# Patient Record
Sex: Female | Born: 1950 | Race: White | Hispanic: No | Marital: Married | Smoking: Former smoker
Health system: Southern US, Community
[De-identification: ages and names within clinical notes are randomized; demographics above are authoritative.]

## PROBLEM LIST (undated history)

## (undated) DIAGNOSIS — A499 Bacterial infection, unspecified: Secondary | ICD-10-CM

## (undated) DIAGNOSIS — M1990 Unspecified osteoarthritis, unspecified site: Secondary | ICD-10-CM

## (undated) DIAGNOSIS — N898 Other specified noninflammatory disorders of vagina: Secondary | ICD-10-CM

## (undated) DIAGNOSIS — F419 Anxiety disorder, unspecified: Secondary | ICD-10-CM

## (undated) DIAGNOSIS — N3941 Urge incontinence: Secondary | ICD-10-CM

## (undated) DIAGNOSIS — Z8619 Personal history of other infectious and parasitic diseases: Secondary | ICD-10-CM

## (undated) DIAGNOSIS — H3530 Unspecified macular degeneration: Secondary | ICD-10-CM

## (undated) DIAGNOSIS — O00109 Unspecified tubal pregnancy without intrauterine pregnancy: Secondary | ICD-10-CM

## (undated) DIAGNOSIS — K635 Polyp of colon: Secondary | ICD-10-CM

## (undated) DIAGNOSIS — B379 Candidiasis, unspecified: Secondary | ICD-10-CM

## (undated) HISTORY — DX: Bacterial infection, unspecified: A49.9

## (undated) HISTORY — PX: VEIN SURGERY: SHX48

## (undated) HISTORY — DX: Unspecified tubal pregnancy without intrauterine pregnancy: O00.109

## (undated) HISTORY — DX: Polyp of colon: K63.5

## (undated) HISTORY — DX: Unspecified osteoarthritis, unspecified site: M19.90

## (undated) HISTORY — DX: Candidiasis, unspecified: B37.9

## (undated) HISTORY — DX: Urge incontinence: N39.41

## (undated) HISTORY — DX: Personal history of other infectious and parasitic diseases: Z86.19

## (undated) HISTORY — DX: Other specified noninflammatory disorders of vagina: N89.8

## (undated) HISTORY — DX: Anxiety disorder, unspecified: F41.9

## (undated) HISTORY — DX: Unspecified macular degeneration: H35.30

## (undated) HISTORY — PX: TUBAL LIGATION: SHX77

---

## 1974-10-15 HISTORY — PX: CHOLECYSTECTOMY: SHX55

## 1999-09-28 ENCOUNTER — Other Ambulatory Visit: Admission: RE | Admit: 1999-09-28 | Discharge: 1999-09-28 | Payer: Self-pay | Admitting: Obstetrics & Gynecology

## 2001-01-23 ENCOUNTER — Other Ambulatory Visit: Admission: RE | Admit: 2001-01-23 | Discharge: 2001-01-23 | Payer: Self-pay | Admitting: *Deleted

## 2001-02-14 ENCOUNTER — Encounter: Admission: RE | Admit: 2001-02-14 | Discharge: 2001-02-14 | Payer: Self-pay | Admitting: *Deleted

## 2001-02-14 ENCOUNTER — Encounter: Payer: Self-pay | Admitting: *Deleted

## 2003-12-29 ENCOUNTER — Other Ambulatory Visit: Admission: RE | Admit: 2003-12-29 | Discharge: 2003-12-29 | Payer: Self-pay | Admitting: *Deleted

## 2004-02-28 ENCOUNTER — Ambulatory Visit (HOSPITAL_COMMUNITY): Admission: RE | Admit: 2004-02-28 | Discharge: 2004-02-28 | Payer: Self-pay | Admitting: Gastroenterology

## 2004-02-28 ENCOUNTER — Encounter (INDEPENDENT_AMBULATORY_CARE_PROVIDER_SITE_OTHER): Payer: Self-pay | Admitting: *Deleted

## 2005-03-30 ENCOUNTER — Encounter: Admission: RE | Admit: 2005-03-30 | Discharge: 2005-03-30 | Payer: Self-pay | Admitting: *Deleted

## 2005-04-25 ENCOUNTER — Other Ambulatory Visit: Admission: RE | Admit: 2005-04-25 | Discharge: 2005-04-25 | Payer: Self-pay | Admitting: *Deleted

## 2005-11-22 ENCOUNTER — Encounter: Admission: RE | Admit: 2005-11-22 | Discharge: 2006-01-03 | Payer: Self-pay | Admitting: Family Medicine

## 2006-08-05 ENCOUNTER — Encounter: Admission: RE | Admit: 2006-08-05 | Discharge: 2006-08-05 | Payer: Self-pay | Admitting: Obstetrics and Gynecology

## 2007-04-14 ENCOUNTER — Other Ambulatory Visit: Admission: RE | Admit: 2007-04-14 | Discharge: 2007-04-14 | Payer: Self-pay | Admitting: Obstetrics and Gynecology

## 2009-06-16 ENCOUNTER — Encounter: Admission: RE | Admit: 2009-06-16 | Discharge: 2009-06-16 | Payer: Self-pay | Admitting: Obstetrics and Gynecology

## 2009-10-18 ENCOUNTER — Emergency Department (HOSPITAL_COMMUNITY): Admission: EM | Admit: 2009-10-18 | Discharge: 2009-10-18 | Payer: Self-pay | Admitting: Emergency Medicine

## 2011-03-02 NOTE — Op Note (Signed)
NAME:  Judith Gamble, Judith Gamble                    ACCOUNT NO.:  [id number]   MEDICAL RECORD NO.:  [id number]                   PATIENT TYPE:  AMB   LOCATION:  ENDO                                 FACILITY:  [facility]   PHYSICIAN:  Anselmo Rod, M.D.               DATE OF BIRTH:  [birth year]   DATE OF PROCEDURE:  05/24/2004  DATE OF DISCHARGE:  02/28/2004                                 OPERATIVE REPORT   PROCEDURE PERFORMED:  Colonoscopy with snare polypectomy times three.   ENDOSCOPIST:  Charna Elizabeth, M.D.   INSTRUMENT USED:  Olympus video colonoscope.   INDICATIONS FOR PROCEDURE:  The patient is a 60 year old white female  undergoing screening colonoscopy for occasional bright red blood per rectum  and family history of colon cancer in a brother who was diagnosed at 10 and  died at 83.  Rule out colonic polyps, masses, etc.   PREPROCEDURE PREPARATION:  Informed consent was procured from the patient.  The patient was fasted for eight hours prior to the procedure and prepped  with a bottle of magnesium citrate and a gallon of GoLYTELY the night prior  to the procedure.   PREPROCEDURE PHYSICAL:  The patient had stable vital signs.  Neck supple.  Chest clear to auscultation.  S1 and S2 regular.  Abdomen soft with normal  bowel sounds.   DESCRIPTION OF PROCEDURE:  The patient was placed in left lateral decubitus  position and sedated with 100 mg of Demerol and 10 mg of Versed in slow  incremental doses.  Once the patient was adequately sedated and maintained  on low flow oxygen and continuous cardiac monitoring, the Olympus video  colonoscope was advanced from the rectum to the cecum.  A small sessile  polyp was snared from 40 cm.  Two small sessile polyps were snared from the  right colon.  Small internal hemorrhoids were seen on retroflexion. The rest  of the exam was unremarkable.   IMPRESSION:  1. Three polyps snared from the colon, one from 3 cm and two from the right      colon.  2. Small internal hemorrhoids.  3. No evidence of diverticulosis or large masses.   RECOMMENDATIONS:  1. Await pathology results.  2. Repeat colorectal cancer screening depending on pathology results and     patient's symptoms in the future.  3. Avoid all nonsteroidals for the next four weeks.  4. Outpatient followup in the next two weeks for further recommendations.                                               Anselmo Rod, M.D.    JNM/MEDQ  D:  05/24/2004  T:  05/24/2004  Job:  [id number]   cc:   Andres Ege, M.D.  [address]  [place]  [place] [address]  Fax: [phone]

## 2012-01-17 ENCOUNTER — Encounter: Payer: Self-pay | Admitting: Internal Medicine

## 2012-01-17 ENCOUNTER — Ambulatory Visit (INDEPENDENT_AMBULATORY_CARE_PROVIDER_SITE_OTHER): Payer: 59 | Admitting: Internal Medicine

## 2012-01-17 VITALS — BP 118/76 | HR 77 | Temp 97.5°F | Ht 64.0 in | Wt 245.1 lb

## 2012-01-17 DIAGNOSIS — N951 Menopausal and female climacteric states: Secondary | ICD-10-CM

## 2012-01-17 DIAGNOSIS — K635 Polyp of colon: Secondary | ICD-10-CM | POA: Insufficient documentation

## 2012-01-17 DIAGNOSIS — F419 Anxiety disorder, unspecified: Secondary | ICD-10-CM | POA: Insufficient documentation

## 2012-01-17 DIAGNOSIS — M1990 Unspecified osteoarthritis, unspecified site: Secondary | ICD-10-CM

## 2012-01-17 DIAGNOSIS — N3941 Urge incontinence: Secondary | ICD-10-CM | POA: Insufficient documentation

## 2012-01-17 DIAGNOSIS — Z780 Asymptomatic menopausal state: Secondary | ICD-10-CM

## 2012-01-17 DIAGNOSIS — G43909 Migraine, unspecified, not intractable, without status migrainosus: Secondary | ICD-10-CM

## 2012-01-17 DIAGNOSIS — R635 Abnormal weight gain: Secondary | ICD-10-CM

## 2012-01-17 LAB — CBC WITH DIFFERENTIAL/PLATELET
Eosinophils Relative: 1 % (ref 0–5)
HCT: 41.7 % (ref 36.0–46.0)
Hemoglobin: 13.2 g/dL (ref 12.0–15.0)
Lymphocytes Relative: 42 % (ref 12–46)
Lymphs Abs: 2.7 10*3/uL (ref 0.7–4.0)
MCV: 95.9 fL (ref 78.0–100.0)
Monocytes Absolute: 0.4 10*3/uL (ref 0.1–1.0)
Neutro Abs: 3.3 10*3/uL (ref 1.7–7.7)
RBC: 4.35 MIL/uL (ref 3.87–5.11)
WBC: 6.5 10*3/uL (ref 4.0–10.5)

## 2012-01-17 LAB — COMPREHENSIVE METABOLIC PANEL
ALT: 15 U/L (ref 0–35)
Albumin: 4.7 g/dL (ref 3.5–5.2)
BUN: 20 mg/dL (ref 6–23)
CO2: 27 mEq/L (ref 19–32)
Calcium: 9.9 mg/dL (ref 8.4–10.5)
Chloride: 103 mEq/L (ref 96–112)
Creat: 0.77 mg/dL (ref 0.50–1.10)
Potassium: 4.5 mEq/L (ref 3.5–5.3)

## 2012-01-17 LAB — LIPID PANEL
Cholesterol: 223 mg/dL — ABNORMAL HIGH (ref 0–200)
HDL: 56 mg/dL (ref >39)
Total CHOL/HDL Ratio: 4 Ratio

## 2012-01-17 LAB — TSH: TSH: 1.289 u[IU]/mL (ref 0.350–4.500)

## 2012-01-17 NOTE — Progress Notes (Signed)
  Subjective:    Patient ID: Judith Gamble, female    DOB: 1950/12/15, 61 y.o.   MRN: [id number]  HPI  New pt here for first visit.  PMH of OA, anxiety, migraine headache, urge incontinence and colon polyps.  She also describes a foot fracture and tendon injury from a prior fall.    Her GYN MD Dr. Pennie Rushing placed her on HT for symptomatic menopause and she started with vaginal spotting.  She has a follow up appt with Dr. Pennie Rushing later this month.  LMP 08/2010.    She is concerned about her increased weight and she has problems with short term memory.  Not sure if she is vitamin deficient.   She admits she eats late at night and sits all day at work and sits and watches TV with her husband.  Very little mobility  No Known Allergies Past Medical History  Diagnosis Date  . Migraine   . Urge incontinence   . Anxiety   . Arthritis   . Colon polyps    Past Surgical History  Procedure Date  . Cholecystectomy 1976   History   Social History  . Marital Status: Married    Spouse Name: N/A    Number of Children: N/A  . Years of Education: N/A   Occupational History  . Not on file.   Social History Main Topics  . Smoking status: Former Smoker    Quit date: 10/16/1975  . Smokeless tobacco: Never Used  . Alcohol Use: Yes     socially  . Drug Use: No  . Sexually Active: Yes    Birth Control/ Protection: Post-menopausal   Other Topics Concern  . Not on file   Social History Narrative  . No narrative on file   Family History  Problem Relation Age of Onset  . Arthritis Mother   . Diabetes Mother   . Heart disease Brother   . Kidney disease Brother   . Colon cancer Brother 48  . Drug abuse Brother    Patient Active Problem List  Diagnoses  . Anxiety  . Arthritis  . Migraine  . Urge incontinence  . Colon polyps   No current outpatient prescriptions on file prior to visit.      Review of Systems    see HPI Objective:   Physical Exam Physical Exam    Nursing note and vitals reviewed.  Constitutional: She is oriented to person, place, and time. She appears well-developed and well-nourished.  HENT:  Head: Normocephalic and atraumatic.  Cardiovascular: Normal rate and regular rhythm. Exam reveals no gallop and no friction rub.  No murmur heard.  Pulmonary/Chest: Breath sounds normal. She has no wheezes. She has no rales.  Neurological: She is alert and oriented to person, place, and time.  Skin: Skin is warm and dry.  Psychiatric: She has a normal mood and affect. Her behavior is normal.        Assessment & Plan:  1)  Weight gain:  Will check thyroid and refer to Dr. Kinnie Scales 2)  Vaginal spotting  I counseled pt to keep appt with Dr. Pennie Rushing  Pap 12/2011 Dr. Pennie Rushing  3)  OA 4)  Anxiety 5)   Migraine headaches  Pt reports less frequent now.   6)  Colon polyps  Last colonoscopy 2008  . Pt is aware she need repeat colonoscopy this year 7)  Memory most likely short term memory loss secondary to aging  Last tetanus 2011

## 2012-01-17 NOTE — Patient Instructions (Signed)
Labs will be mailed to you  Will refer to Dr. Ritta Slot  Schedule  Physical with me

## 2012-01-18 LAB — VITAMIN D 25 HYDROXY (VIT D DEFICIENCY, FRACTURES): Vit D, 25-Hydroxy: 31 ng/mL (ref 30–89)

## 2012-01-22 ENCOUNTER — Encounter: Payer: Self-pay | Admitting: Emergency Medicine

## 2012-01-28 DIAGNOSIS — N762 Acute vulvitis: Secondary | ICD-10-CM | POA: Insufficient documentation

## 2012-01-28 DIAGNOSIS — R5383 Other fatigue: Secondary | ICD-10-CM

## 2012-01-28 DIAGNOSIS — M1990 Unspecified osteoarthritis, unspecified site: Secondary | ICD-10-CM

## 2012-02-04 ENCOUNTER — Encounter: Payer: Self-pay | Admitting: Obstetrics and Gynecology

## 2012-02-19 ENCOUNTER — Encounter: Payer: Self-pay | Admitting: Obstetrics and Gynecology

## 2012-04-02 ENCOUNTER — Other Ambulatory Visit: Payer: Self-pay | Admitting: Internal Medicine

## 2012-04-02 ENCOUNTER — Ambulatory Visit (HOSPITAL_BASED_OUTPATIENT_CLINIC_OR_DEPARTMENT_OTHER)
Admission: RE | Admit: 2012-04-02 | Discharge: 2012-04-02 | Disposition: A | Discharge status: Home / Self Care | Payer: 59 | Source: Ambulatory Visit | Attending: Internal Medicine | Admitting: Internal Medicine

## 2012-04-02 ENCOUNTER — Ambulatory Visit (INDEPENDENT_AMBULATORY_CARE_PROVIDER_SITE_OTHER): Payer: 59 | Admitting: Internal Medicine

## 2012-04-02 ENCOUNTER — Encounter: Payer: Self-pay | Admitting: Internal Medicine

## 2012-04-02 VITALS — BP 118/73 | HR 72 | Temp 97.7°F | Resp 16 | Ht 64.0 in | Wt 247.0 lb

## 2012-04-02 DIAGNOSIS — Z0000 Encounter for general adult medical examination without abnormal findings: Secondary | ICD-10-CM

## 2012-04-02 DIAGNOSIS — K635 Polyp of colon: Secondary | ICD-10-CM

## 2012-04-02 DIAGNOSIS — D126 Benign neoplasm of colon, unspecified: Secondary | ICD-10-CM

## 2012-04-02 DIAGNOSIS — Z1231 Encounter for screening mammogram for malignant neoplasm of breast: Secondary | ICD-10-CM | POA: Insufficient documentation

## 2012-04-02 DIAGNOSIS — E785 Hyperlipidemia, unspecified: Secondary | ICD-10-CM

## 2012-04-02 DIAGNOSIS — M129 Arthropathy, unspecified: Secondary | ICD-10-CM

## 2012-04-02 DIAGNOSIS — N898 Other specified noninflammatory disorders of vagina: Secondary | ICD-10-CM

## 2012-04-02 DIAGNOSIS — N939 Abnormal uterine and vaginal bleeding, unspecified: Secondary | ICD-10-CM | POA: Insufficient documentation

## 2012-04-02 DIAGNOSIS — M1990 Unspecified osteoarthritis, unspecified site: Secondary | ICD-10-CM

## 2012-04-02 DIAGNOSIS — E669 Obesity, unspecified: Secondary | ICD-10-CM

## 2012-04-02 LAB — POCT URINALYSIS DIPSTICK
Bilirubin, UA: NEGATIVE
Blood, UA: NEGATIVE
Glucose, UA: NEGATIVE
Spec Grav, UA: 1.01

## 2012-04-02 NOTE — Progress Notes (Signed)
Subjective:    Patient ID: Judith Gamble, female    DOB: 04/09/1951, 61 y.o.   MRN: [id number]  HPI Judith Gamble is here for comprehensive eval.   She reports she has not seen Dr. Pennie Rushing yet for her vaginal spotting but states she would like to go back to Dr. Particia Jasper office .  She has stopped taking the Cl[place] and has not had any spotting since her last visit.  She has occasional night sweats now.  She did see Dr. Kinnie Scales for weight loss consult .  She is on a 1200 calorie diet.  See lipids.  She does not wish to take statins for this since she is changing her diet.    She did receive notification from Dr. Kenna Gilbert office that she is due for a colonoscopy.  She states she will schedule this.  Allergies  Allergen Reactions  . Mold Extract (Trichophyton Mentagrophyte)    Past Medical History  Diagnosis Date  . Migraine   . Urge incontinence   . Anxiety   . Arthritis   . Colon polyps   . Vaginal dryness   . Bacterial infection   . Yeast infection   . Pregnancy, ectopic, tubal   . Hx of varicella   . Hx of measles    Past Surgical History  Procedure Date  . Cholecystectomy 1976  . Tubal ligation    History   Social History  . Marital Status: Married    Spouse Name: N/A    Number of Children: N/A  . Years of Education: N/A   Occupational History  . Not on file.   Social History Main Topics  . Smoking status: Former Smoker    Quit date: 10/16/1975  . Smokeless tobacco: Never Used  . Alcohol Use: Yes     socially  . Drug Use: No  . Sexually Active: Yes    Birth Control/ Protection: Post-menopausal   Other Topics Concern  . Not on file   Social History Narrative  . No narrative on file   Family History  Problem Relation Age of Onset  . Arthritis Mother   . Diabetes Mother   . Heart disease Brother   . Kidney disease Brother   . Colon cancer Brother 48  . Drug abuse Brother    Patient Active Problem List  Diagnosis  . Anxiety  . Arthritis  .  Migraine  . Urge incontinence  . Colon polyps  . Lethargy  . Vulvitis   Current Outpatient Prescriptions on File Prior to Visit  Medication Sig Dispense Refill  . b complex vitamins tablet Take 1 tablet by mouth daily.      . cholecalciferol (VITAMIN D) 1000 UNITS tablet Take 1,000 Units by mouth daily.      . Melatonin 1 MG TABS Take 1 tablet by mouth at bedtime as needed.      Marland Kitchen estradiol-norethindrone (COMBIPATCH) 0.05-0.25 MG/DAY Place 1 patch onto the skin 2 (two) times a week.          Review of Systems  Constitutional: Negative.   HENT: Negative.   All other systems reviewed and are negative.      Objective:   Physical Exam Physical Exam  Nursing note and vitals reviewed.  Constitutional: She is oriented to person, place, and time. She appears well-developed and well-nourished.  HENT:  Head: Normocephalic and atraumatic.  Right Ear: Tympanic membrane and ear canal normal. No drainage. Tympanic membrane is not injected and not erythematous.  Left Ear: Tympanic  membrane and ear canal normal. No drainage. Tympanic membrane is not injected and not erythematous.  Nose: Nose normal. Right sinus exhibits no maxillary sinus tenderness and no frontal sinus tenderness. Left sinus exhibits no maxillary sinus tenderness and no frontal sinus tenderness.  Mouth/Throat: Oropharynx is clear and moist. No oral lesions. No oropharyngeal exudate.  Eyes: Conjunctivae and EOM are normal. Pupils are equal, round, and reactive to light.  Neck: Normal range of motion. Neck supple. No JVD present. Carotid bruit is not present. No mass and no thyromegaly present.  Cardiovascular: Normal rate, regular rhythm, S1 normal, S2 normal and intact distal pulses. Exam reveals no gallop and no friction rub.  No murmur heard.  Pulses:  Carotid pulses are 2+ on the right side, and 2+ on the left side.  Dorsalis pedis pulses are 2+ on the right side, and 2+ on the left side.  No carotid bruit. No LE edema    Pulmonary/Chest: Breath sounds normal. She has no wheezes. She has no rales. She exhibits no tenderness. Breasts:  No discrete masses no nipple discharge no axillary adenopathy bilaterally Abdominal: Soft. Bowel sounds are normal. She exhibits no distension and no mass. There is no hepatosplenomegaly. There is no tenderness. There is no CVA tenderness.  Musculoskeletal: Normal range of motion.  No active synovitis to joints.  Lymphadenopathy:  She has no cervical adenopathy.  She has no axillary adenopathy.  Right: No inguinal and no supraclavicular adenopathy present.  Left: No inguinal and no supraclavicular adenopathy present.  Neurological: She is alert and oriented to person, place, and time. She has normal strength and normal reflexes. She displays no tremor. No cranial nerve deficit or sensory deficit. Coordination and gait normal.  Skin: Skin is warm and dry. No rash noted. No cyanosis. Nails show no clubbing.  Psychiatric: She has a normal mood and affect. Her speech is normal and behavior is normal. Cognition and memory are normal.           Assessment & Plan:  Health Maintenance:  Pt advised to schedule colonoscopy with Dr. Loreta Ave,  Mammogram today.  Will give RX for Zostavax vaccine.  Tdap done 2011.  Pap to be done by her GYN  Vaginal spotting  I counseled pt that she may need endometrial biopsy and it is very important to make appt with GYN.  She wishes to go back to Dr. Buddy Duty' office and states she will make appt with Dr. Algie Coffer  Hyperlipidemia  Fish oil  , Dash diet recheck 6 months  Obesity  Dr. Kinnie Scales managing  DJD  Colon polyps  See above    History of migraine headache

## 2012-04-02 NOTE — Patient Instructions (Addendum)
Call for appt with your GI doctor  Dr. Loreta Ave and your GYN at Dr. Particia Jasper office

## 2012-07-23 ENCOUNTER — Ambulatory Visit: Payer: 59 | Admitting: Internal Medicine

## 2012-07-23 ENCOUNTER — Ambulatory Visit (INDEPENDENT_AMBULATORY_CARE_PROVIDER_SITE_OTHER): Payer: 59 | Admitting: Internal Medicine

## 2012-07-23 ENCOUNTER — Encounter: Payer: Self-pay | Admitting: Internal Medicine

## 2012-07-23 ENCOUNTER — Ambulatory Visit (HOSPITAL_BASED_OUTPATIENT_CLINIC_OR_DEPARTMENT_OTHER)
Admission: RE | Admit: 2012-07-23 | Discharge: 2012-07-23 | Disposition: A | Discharge status: Home / Self Care | Payer: 59 | Source: Ambulatory Visit | Attending: Internal Medicine | Admitting: Internal Medicine

## 2012-07-23 VITALS — BP 110/76 | HR 86 | Temp 97.1°F | Resp 20 | Wt 224.4 lb

## 2012-07-23 DIAGNOSIS — H53489 Generalized contraction of visual field, unspecified eye: Secondary | ICD-10-CM | POA: Insufficient documentation

## 2012-07-23 DIAGNOSIS — R42 Dizziness and giddiness: Secondary | ICD-10-CM | POA: Insufficient documentation

## 2012-07-23 DIAGNOSIS — R519 Headache, unspecified: Secondary | ICD-10-CM | POA: Insufficient documentation

## 2012-07-23 DIAGNOSIS — R51 Headache: Secondary | ICD-10-CM

## 2012-07-23 MED ORDER — SUMATRIPTAN SUCCINATE 50 MG PO TABS: ORAL_TABLET | ORAL | Status: DC

## 2012-07-23 NOTE — Progress Notes (Signed)
Subjective:    Patient ID: Judith Gamble, female    DOB: [birth year], 61 y.o.   MRN: [id number]  HPI Lindzey is here for acute visit.  She has been having daily headache for the past 6-8 weeks associated with tunnel vision, some nausea, no vomiting.  Headache present in the morning.  She was told by someone in an opthalmologist office that she may have migraines. Only taken OTC meds  Headache located bilateral  Parietal area, associated with .  No FH of migraine to her knowledge.  Has photophobia no phonophobia.  Also associated with "room spinning"  She has been seeing chiropracter for this thinking it was a problem with her neck stiffness  Allergies  Allergen Reactions  . Mold Extract (Trichophyton Mentagrophyte)    Past Medical History  Diagnosis Date  . Migraine   . Urge incontinence   . Anxiety   . Arthritis   . Colon polyps   . Vaginal dryness   . Bacterial infection   . Yeast infection   . Pregnancy, ectopic, tubal   . Hx of varicella   . Hx of measles    Past Surgical History  Procedure Date  . Cholecystectomy 1976  . Tubal ligation    History   Social History  . Marital Status: Married    Spouse Name: N/A    Number of Children: N/A  . Years of Education: N/A   Occupational History  . Not on file.   Social History Main Topics  . Smoking status: Former Smoker    Quit date: 10/16/1975  . Smokeless tobacco: Never Used  . Alcohol Use: Yes     socially  . Drug Use: No  . Sexually Active: Yes    Birth Control/ Protection: Post-menopausal   Other Topics Concern  . Not on file   Social History Narrative  . No narrative on file   Family History  Problem Relation Age of Onset  . Arthritis Mother   . Diabetes Mother   . Heart disease Brother   . Kidney disease Brother   . Colon cancer Brother 48  . Drug abuse Brother    Patient Active Problem List  Diagnosis  . Anxiety  . Arthritis  . Urge incontinence  . Colon polyps  . Lethargy  .  Vulvitis  . Hyperlipidemia  . Vaginal spotting  . Obesity  . Vertigo  . Headache   Current Outpatient Prescriptions on File Prior to Visit  Medication Sig Dispense Refill  . b complex vitamins tablet Take 1 tablet by mouth daily.      . cholecalciferol (VITAMIN D) 1000 UNITS tablet Take 1,000 Units by mouth daily.      . phentermine 37.5 MG capsule Take 37.5 mg by mouth every morning.      . Melatonin 1 MG TABS Take 1 tablet by mouth at bedtime as needed.      . SUMAtriptan (IMITREX) 50 MG tablet Take one tablet at onset of migraine.  May repeat one time only in 2 hours  10 tablet  0       Review of Systems See HPI    Objective:   Physical Exam Physical Exam  Nursing note and vitals reviewed.  Constitutional: She is oriented to person, place, and time. She appears well-developed and well-nourished.  HENT:  Head: Normocephalic and atraumatic.  Cardiovascular: Normal rate and regular rhythm. Exam reveals no gallop and no friction rub.  No murmur heard.  Pulmonary/Chest: Breath sounds normal. She  has no wheezes. She has no rales.  Neurological: She is alert and oriented to person, place, and time.  CNII-XII intact Reflexes 2+ symmetric Motor 5/5 uE and LE Romberg  Neg Sensory intact to pp no asymmetry Cerebellar intact FTN Skin: Skin is warm and dry.  Psychiatric: She has a normal mood and affect. Her behavior is normal.              Assessment & Plan:  Headache:  Given constellation of symptoms with visual changes and vertigo will get Head CT.  Imitrex 50 mg given in office without side effects.  Ok to take 100 mg in am if not better.  Pt counseled not to take more that 2 Imitrex in 24 hours  Vertigo  Antivert 25 mg q8h prm  Rx e-scribed  Further management based on results.  She is to see me in office next week

## 2012-07-24 ENCOUNTER — Telehealth: Payer: Self-pay | Admitting: Internal Medicine

## 2012-07-24 MED ORDER — MECLIZINE HCL 25 MG PO TABS: 25.0000 mg | ORAL_TABLET | Freq: Three times a day (TID) | ORAL | Status: DC | PRN

## 2012-07-24 NOTE — Telephone Encounter (Signed)
Judith Gamble  Call pt and let her know that her Head CT is negative .  I do not see a follow up appt with me next week.  Give her an appt for later in the week and let her know I want to see how her headaches are doing and we can go over CT in detail  Message back with appt date    thanks

## 2012-07-24 NOTE — Telephone Encounter (Signed)
Called and left message regarding appt date next week

## 2012-07-29 ENCOUNTER — Telehealth: Payer: Self-pay | Admitting: *Deleted

## 2012-07-29 NOTE — Telephone Encounter (Signed)
Judith Gamble   Call pt and give her a follow up appt next week

## 2012-07-29 NOTE — Telephone Encounter (Signed)
appt wed 23 at 430

## 2012-07-29 NOTE — Telephone Encounter (Signed)
Call pt and give her an appt next week

## 2012-07-29 NOTE — Telephone Encounter (Signed)
Awaiting return call for follow nup appt next weekl

## 2012-08-06 ENCOUNTER — Ambulatory Visit (INDEPENDENT_AMBULATORY_CARE_PROVIDER_SITE_OTHER): Payer: 59 | Admitting: Internal Medicine

## 2012-08-06 ENCOUNTER — Encounter: Payer: Self-pay | Admitting: Internal Medicine

## 2012-08-06 VITALS — BP 119/80 | HR 86 | Temp 97.6°F | Resp 20 | Wt 223.0 lb

## 2012-08-06 DIAGNOSIS — R51 Headache: Secondary | ICD-10-CM

## 2012-08-06 DIAGNOSIS — Z23 Encounter for immunization: Secondary | ICD-10-CM

## 2012-08-06 DIAGNOSIS — R42 Dizziness and giddiness: Secondary | ICD-10-CM

## 2012-08-06 MED ORDER — TRAMADOL-ACETAMINOPHEN 37.5-325 MG PO TABS: 1.0000 | ORAL_TABLET | Freq: Four times a day (QID) | ORAL | Status: DC | PRN

## 2012-08-06 NOTE — Progress Notes (Signed)
Subjective:    Patient ID: Judith Gamble, female    DOB: 04/27/1951, 61 y.o.   MRN: [id number]  HPI Judith Gamble is here for follow up.  She states Imitrex in minimally helpful and she was only allowed 4 tablets with her insurance.  Vertigo is improving and comes rarely now  She does have some neck stiffness but this has been longstanding over years.  Denies injury, she has been exercising more.  She has seen a chirpopracter in the past  She has seen Dr. Randon Goldsmith the opthalmologist who says she has a vitreous tear in L eye  Ct of brain is negative  Allergies  Allergen Reactions  . Mold Extract (Trichophyton Mentagrophyte)    Past Medical History  Diagnosis Date  . Migraine   . Urge incontinence   . Anxiety   . Arthritis   . Colon polyps   . Vaginal dryness   . Bacterial infection   . Yeast infection   . Pregnancy, ectopic, tubal   . Hx of varicella   . Hx of measles    Past Surgical History  Procedure Date  . Cholecystectomy 1976  . Tubal ligation    History   Social History  . Marital Status: Married    Spouse Name: N/A    Number of Children: N/A  . Years of Education: N/A   Occupational History  . Not on file.   Social History Main Topics  . Smoking status: Former Smoker    Quit date: 10/16/1975  . Smokeless tobacco: Never Used  . Alcohol Use: Yes     socially  . Drug Use: No  . Sexually Active: Yes    Birth Control/ Protection: Post-menopausal   Other Topics Concern  . Not on file   Social History Narrative  . No narrative on file   Family History  Problem Relation Age of Onset  . Arthritis Mother   . Diabetes Mother   . Heart disease Brother   . Kidney disease Brother   . Colon cancer Brother 48  . Drug abuse Brother    Patient Active Problem List  Diagnosis  . Anxiety  . Arthritis  . Urge incontinence  . Colon polyps  . Lethargy  . Vulvitis  . Hyperlipidemia  . Vaginal spotting  . Obesity  . Vertigo  . Headache   Current  Outpatient Prescriptions on File Prior to Visit  Medication Sig Dispense Refill  . b complex vitamins tablet Take 1 tablet by mouth daily.      . cholecalciferol (VITAMIN D) 1000 UNITS tablet Take 1,000 Units by mouth daily.      . SUMAtriptan (IMITREX) 50 MG tablet Take one tablet at onset of migraine.  May repeat one time only in 2 hours  10 tablet  0  . meclizine (ANTIVERT) 25 MG tablet Take 1 tablet (25 mg total) by mouth 3 (three) times daily as needed.  30 tablet  0  . Melatonin 1 MG TABS Take 1 tablet by mouth at bedtime as needed.      . phentermine 37.5 MG capsule Take 37.5 mg by mouth every morning.           Review of Systems    see HPI Objective:   Physical Exam Physical Exam  Nursing note and vitals reviewed.  Constitutional: She is oriented to person, place, and time. She appears well-developed and well-nourished.  HENT:  Head: Normocephalic and atraumatic.  Cardiovascular: Normal rate and regular rhythm. Exam reveals no  gallop and no friction rub.  No murmur heard.  Pulmonary/Chest: Breath sounds normal. She has no wheezes. She has no rales.  Neurological: She is alert and oriented to person, place, and time.  Exam unchanged from last viist Skin: Skin is warm and dry.  Psychiatric: She has a normal mood and affect. Her behavior is normal.             Assessment & Plan:  Headache:  Ok to give ultracet on a short term basis and will refer to [facility].  Rx given  Will give influenza vaccine

## 2012-08-21 ENCOUNTER — Encounter: Payer: Self-pay | Admitting: Internal Medicine

## 2012-08-21 ENCOUNTER — Ambulatory Visit (INDEPENDENT_AMBULATORY_CARE_PROVIDER_SITE_OTHER): Payer: 59 | Admitting: Internal Medicine

## 2012-08-21 VITALS — BP 124/73 | HR 66 | Temp 97.7°F | Resp 20 | Wt 221.0 lb

## 2012-08-21 DIAGNOSIS — R3915 Urgency of urination: Secondary | ICD-10-CM

## 2012-08-21 DIAGNOSIS — N390 Urinary tract infection, site not specified: Secondary | ICD-10-CM

## 2012-08-21 LAB — POCT URINALYSIS DIPSTICK
Blood, UA: NEGATIVE
Nitrite, UA: NEGATIVE
Spec Grav, UA: 1.02
Urobilinogen, UA: NEGATIVE
pH, UA: 6.5

## 2012-08-21 MED ORDER — CIPROFLOXACIN HCL 500 MG PO TABS: 500.0000 mg | ORAL_TABLET | Freq: Two times a day (BID) | ORAL | Status: DC

## 2012-08-21 NOTE — Patient Instructions (Addendum)
See me as needed 

## 2012-08-21 NOTE — Progress Notes (Signed)
Subjective:    Patient ID: Judith Gamble, female    DOB: 16-Jan-1951, 61 y.o.   MRN: [id number]  HPI Kenlei is here with acute visit.  She has had dysuria and frequency last several days.  Felt like fever but not documented.  Some nausea but no vomiting.  She did start topamax yesterday    No CVA pain  Allergies  Allergen Reactions  . Mold Extract (Trichophyton Mentagrophyte)    Past Medical History  Diagnosis Date  . Migraine   . Urge incontinence   . Anxiety   . Arthritis   . Colon polyps   . Vaginal dryness   . Bacterial infection   . Yeast infection   . Pregnancy, ectopic, tubal   . Hx of varicella   . Hx of measles    Past Surgical History  Procedure Date  . Cholecystectomy 1976  . Tubal ligation    History   Social History  . Marital Status: Married    Spouse Name: N/A    Number of Children: N/A  . Years of Education: N/A   Occupational History  . Not on file.   Social History Main Topics  . Smoking status: Former Smoker    Quit date: 10/16/1975  . Smokeless tobacco: Never Used  . Alcohol Use: Yes     Comment: socially  . Drug Use: No  . Sexually Active: Yes    Birth Control/ Protection: Post-menopausal   Other Topics Concern  . Not on file   Social History Narrative  . No narrative on file   Family History  Problem Relation Age of Onset  . Arthritis Mother   . Diabetes Mother   . Heart disease Brother   . Kidney disease Brother   . Colon cancer Brother 48  . Drug abuse Brother    Patient Active Problem List  Diagnosis  . Anxiety  . Arthritis  . Urge incontinence  . Colon polyps  . Lethargy  . Vulvitis  . Hyperlipidemia  . Vaginal spotting  . Obesity  . Vertigo  . Headache  . UTI (lower urinary tract infection)   Current Outpatient Prescriptions on File Prior to Visit  Medication Sig Dispense Refill  . topiramate (TOPAMAX) 15 MG capsule Take 15 mg by mouth 2 (two) times daily.      Marland Kitchen b complex vitamins tablet Take 1  tablet by mouth daily.      . cholecalciferol (VITAMIN D) 1000 UNITS tablet Take 1,000 Units by mouth daily.      . meclizine (ANTIVERT) 25 MG tablet Take 1 tablet (25 mg total) by mouth 3 (three) times daily as needed.  30 tablet  0  . Melatonin 1 MG TABS Take 1 tablet by mouth at bedtime as needed.      . phentermine 37.5 MG capsule Take 37.5 mg by mouth every morning.      . SUMAtriptan (IMITREX) 50 MG tablet Take one tablet at onset of migraine.  May repeat one time only in 2 hours  10 tablet  0  . traMADol-acetaminophen (ULTRACET) 37.5-325 MG per tablet Take 1 tablet by mouth every 6 (six) hours as needed for pain.  30 tablet  1         Review of Systems See HPI    Objective:   Physical Exam Physical Exam  Nursing note and vitals reviewed.  Constitutional: She is oriented to person, place, and time. She appears well-developed and well-nourished.  HENT:  Head: Normocephalic and  atraumatic.  Cardiovascular: Normal rate and regular rhythm. Exam reveals no gallop and no friction rub.  No murmur heard.  Pulmonary/Chest: Breath sounds normal. She has no wheezes. She has no rales. Abd:  Soft /ND  No CVA tenderness BS +  Tender in suprapubic area Neurological: She is alert and oriented to person, place, and time.  Skin: Skin is warm and dry.  Psychiatric: She has a normal mood and affect. Her behavior is normal.              Assessment & Plan:  UTI  LE on U/A  Will give cipro

## 2012-08-24 LAB — URINE CULTURE

## 2012-11-10 ENCOUNTER — Telehealth: Payer: Self-pay | Admitting: Internal Medicine

## 2012-11-10 MED ORDER — NITROFURANTOIN MACROCRYSTAL 100 MG PO CAPS: ORAL_CAPSULE | ORAL | Status: DC

## 2012-11-10 NOTE — Telephone Encounter (Signed)
Spoke with pt .  Still having urinary symptoms  Will give 2 week course of macrodantin and instructed pt to see me in 3 weeks for repeat U/A to test for cure.  She voices understanding

## 2012-11-10 NOTE — Telephone Encounter (Signed)
Pt states she is suffering with a urinary tract infection... She would like to know if something can be prescribe to her... She states the last meds did not work to well.... She would like a phone call back [phone].Marland KitchenMarland Kitchen

## 2012-11-13 ENCOUNTER — Encounter: Payer: Self-pay | Admitting: Internal Medicine

## 2012-11-13 ENCOUNTER — Ambulatory Visit (INDEPENDENT_AMBULATORY_CARE_PROVIDER_SITE_OTHER): Payer: Managed Care, Other (non HMO) | Admitting: Internal Medicine

## 2012-11-13 VITALS — BP 120/72 | HR 68 | Temp 97.4°F | Resp 18 | Wt 226.0 lb

## 2012-11-13 DIAGNOSIS — B359 Dermatophytosis, unspecified: Secondary | ICD-10-CM | POA: Insufficient documentation

## 2012-11-13 DIAGNOSIS — N390 Urinary tract infection, site not specified: Secondary | ICD-10-CM

## 2012-11-13 DIAGNOSIS — R3915 Urgency of urination: Secondary | ICD-10-CM

## 2012-11-13 LAB — POCT URINALYSIS DIPSTICK
Clarity, UA: NEGATIVE
Nitrite, UA: NEGATIVE
Spec Grav, UA: <=1.005
Urobilinogen, UA: NEGATIVE

## 2012-11-13 MED ORDER — NYSTATIN-TRIAMCINOLONE 100000-0.1 UNIT/GM-% EX OINT: TOPICAL_OINTMENT | Freq: Two times a day (BID) | CUTANEOUS | Status: DC

## 2012-11-13 NOTE — Patient Instructions (Signed)
See me as needed 

## 2012-11-13 NOTE — Progress Notes (Signed)
Subjective:    Patient ID: Judith Gamble, female    DOB: 1951-05-15, 62 y.o.   MRN: [id number]  HPI Kaislyn is here for follow up.  See culture  Multiple types.  She has finished course of Cipro and 4 days ago started Macrodantin.   She says that she feels swollen in her vaginal area and has a burning feeling on her skin  Allergies  Allergen Reactions  . Mold Extract (Trichophyton Mentagrophyte)    Past Medical History  Diagnosis Date  . Migraine   . Urge incontinence   . Anxiety   . Arthritis   . Colon polyps   . Vaginal dryness   . Bacterial infection   . Yeast infection   . Pregnancy, ectopic, tubal   . Hx of varicella   . Hx of measles    Past Surgical History  Procedure Date  . Cholecystectomy 1976  . Tubal ligation   . Vein surgery    History   Social History  . Marital Status: Married    Spouse Name: N/A    Number of Children: N/A  . Years of Education: N/A   Occupational History  . Not on file.   Social History Main Topics  . Smoking status: Former Smoker    Quit date: 10/16/1975  . Smokeless tobacco: Never Used  . Alcohol Use: Yes     Comment: socially  . Drug Use: No  . Sexually Active: Yes    Birth Control/ Protection: Post-menopausal   Other Topics Concern  . Not on file   Social History Narrative  . No narrative on file   Family History  Problem Relation Age of Onset  . Arthritis Mother   . Diabetes Mother   . Heart disease Brother   . Kidney disease Brother   . Colon cancer Brother 48  . Drug abuse Brother    Patient Active Problem List  Diagnosis  . Anxiety  . Arthritis  . Urge incontinence  . Colon polyps  . Lethargy  . Vulvitis  . Hyperlipidemia  . Vaginal spotting  . Obesity  . Vertigo  . Headache  . UTI (lower urinary tract infection)   Current Outpatient Prescriptions on File Prior to Visit  Medication Sig Dispense Refill  . nitrofurantoin (MACRODANTIN) 100 MG capsule Take one tablet bid for 2 weeks  28  capsule  0  . b complex vitamins tablet Take 1 tablet by mouth daily.      . baclofen (LIORESAL) 20 MG tablet Take 20 mg by mouth as needed.      . cholecalciferol (VITAMIN D) 1000 UNITS tablet Take 1,000 Units by mouth daily.      . ciprofloxacin (CIPRO) 500 MG tablet Take 1 tablet (500 mg total) by mouth 2 (two) times daily.  14 tablet  0  . meclizine (ANTIVERT) 25 MG tablet Take 1 tablet (25 mg total) by mouth 3 (three) times daily as needed.  30 tablet  0  . Melatonin 1 MG TABS Take 1 tablet by mouth at bedtime as needed.      . phentermine 37.5 MG capsule Take 37.5 mg by mouth every morning.      . SUMAtriptan (IMITREX) 50 MG tablet Take one tablet at onset of migraine.  May repeat one time only in 2 hours  10 tablet  0  . topiramate (TOPAMAX) 15 MG capsule Take 15 mg by mouth 2 (two) times daily.      . traMADol-acetaminophen (ULTRACET) 37.5-325 MG  per tablet Take 1 tablet by mouth every 6 (six) hours as needed for pain.  30 tablet  1       Review of Systems See HPI    Objective:   Physical Exam  Physical Exam  Nursing note and vitals reviewed.  Constitutional: She is oriented to person, place, and time. She appears well-developed and well-nourished.  HENT:  Head: Normocephalic and atraumatic.  Cardiovascular: Normal rate and regular rhythm. Exam reveals no gallop and no friction rub.  No murmur heard.  Pulmonary/Chest: Breath sounds normal. She has no wheezes. She has no rales. Pelvic  Limited to external eval  Only.  Slightly reddened rash area limited to labia  Neurological: She is alert and oriented to person, place, and time.  Skin: Skin is warm and dry.  Psychiatric: She has a normal mood and affect. Her behavior is normal.        Assessment & Plan:  Tinea  Will give empiric Mycolog creme to use bid  Recent urinary urgency  She can stop  Macrodantin  U/A today  Normal    See me as needed

## 2013-05-04 ENCOUNTER — Other Ambulatory Visit: Payer: Self-pay | Admitting: Dermatology

## 2013-06-17 ENCOUNTER — Other Ambulatory Visit: Payer: Self-pay | Admitting: Dermatology

## 2013-08-04 ENCOUNTER — Ambulatory Visit (HOSPITAL_BASED_OUTPATIENT_CLINIC_OR_DEPARTMENT_OTHER)
Admission: RE | Admit: 2013-08-04 | Discharge: 2013-08-04 | Disposition: A | Discharge status: Home / Self Care | Payer: 59 | Source: Ambulatory Visit | Attending: Internal Medicine | Admitting: Internal Medicine

## 2013-08-04 ENCOUNTER — Ambulatory Visit (INDEPENDENT_AMBULATORY_CARE_PROVIDER_SITE_OTHER): Payer: 59 | Admitting: Internal Medicine

## 2013-08-04 ENCOUNTER — Encounter: Payer: Self-pay | Admitting: Internal Medicine

## 2013-08-04 VITALS — HR 68 | Temp 97.4°F | Resp 18 | Wt 242.0 lb

## 2013-08-04 DIAGNOSIS — M7989 Other specified soft tissue disorders: Secondary | ICD-10-CM | POA: Insufficient documentation

## 2013-08-04 DIAGNOSIS — R609 Edema, unspecified: Secondary | ICD-10-CM

## 2013-08-04 DIAGNOSIS — I8390 Asymptomatic varicose veins of unspecified lower extremity: Secondary | ICD-10-CM | POA: Insufficient documentation

## 2013-08-04 DIAGNOSIS — E785 Hyperlipidemia, unspecified: Secondary | ICD-10-CM

## 2013-08-04 LAB — CBC WITH DIFFERENTIAL/PLATELET
Basophils Absolute: 0 10*3/uL (ref 0.0–0.1)
Eosinophils Absolute: 0 10*3/uL (ref 0.0–0.7)
Eosinophils Relative: 1 % (ref 0–5)
Lymphocytes Relative: 36 % (ref 12–46)
MCH: 30.8 pg (ref 26.0–34.0)
MCV: 90.8 fL (ref 78.0–100.0)
Platelets: 364 10*3/uL (ref 150–400)
RDW: 13.2 % (ref 11.5–15.5)
WBC: 5.7 10*3/uL (ref 4.0–10.5)

## 2013-08-04 LAB — LIPID PANEL
LDL Cholesterol: 123 mg/dL — ABNORMAL HIGH (ref 0–99)
Total CHOL/HDL Ratio: 3.4 Ratio
VLDL: 14 mg/dL (ref 0–40)

## 2013-08-04 LAB — COMPREHENSIVE METABOLIC PANEL
ALT: 17 U/L (ref 0–35)
AST: 14 U/L (ref 0–37)
Alkaline Phosphatase: 63 U/L (ref 39–117)
BUN: 17 mg/dL (ref 6–23)
Calcium: 9.2 mg/dL (ref 8.4–10.5)
Chloride: 105 mEq/L (ref 96–112)
Creat: 0.72 mg/dL (ref 0.50–1.10)
Sodium: 139 mEq/L (ref 135–145)
Total Bilirubin: 0.5 mg/dL (ref 0.3–1.2)
Total Protein: 6.9 g/dL (ref 6.0–8.3)

## 2013-08-04 MED ORDER — HYDROCHLOROTHIAZIDE 25 MG PO TABS: 25.0000 mg | ORAL_TABLET | Freq: Every day | ORAL | Status: DC

## 2013-08-04 MED ORDER — AZITHROMYCIN 250 MG PO TABS: ORAL_TABLET | ORAL | Status: DC

## 2013-08-04 NOTE — Addendum Note (Signed)
Addended by: Raechel Chute D on: 08/04/2013 02:28 PM   Modules accepted: Orders

## 2013-08-04 NOTE — Progress Notes (Addendum)
Subjective:    Patient ID: Judith Gamble, female    DOB: Aug 12, 1951, 62 y.o.   MRN: [id number]  HPI  Judith Gamble is here for acute visit  She reports she had venous surgery this summer at [facility].   She as had edema off and on but ow edema worse in R LE .  Some calf pain  .  Sometimes associate with redness but not a lot of redness today.    Allergies  Allergen Reactions  . Mold Extract [Trichophyton Mentagrophyte]    Past Medical History  Diagnosis Date  . Migraine   . Urge incontinence   . Anxiety   . Arthritis   . Colon polyps   . Vaginal dryness   . Bacterial infection   . Yeast infection   . Pregnancy, ectopic, tubal   . Hx of varicella   . Hx of measles    Past Surgical History  Procedure Laterality Date  . Cholecystectomy  1976  . Tubal ligation    . Vein surgery     History   Social History  . Marital Status: Married    Spouse Name: N/A    Number of Children: N/A  . Years of Education: N/A   Occupational History  . Not on file.   Social History Main Topics  . Smoking status: Former Smoker    Quit date: 10/16/1975  . Smokeless tobacco: Never Used  . Alcohol Use: Yes     Comment: socially  . Drug Use: No  . Sexual Activity: Yes    Birth Control/ Protection: Post-menopausal   Other Topics Concern  . Not on file   Social History Narrative  . No narrative on file   Family History  Problem Relation Age of Onset  . Arthritis Mother   . Diabetes Mother   . Heart disease Brother   . Kidney disease Brother   . Colon cancer Brother 48  . Drug abuse Brother    Patient Active Problem List   Diagnosis Date Noted  . Varicose veins 08/04/2013  . Tinea 11/13/2012  . Urinary urgency 11/13/2012  . UTI (lower urinary tract infection) 08/21/2012  . Vertigo 07/23/2012  . Headache 07/23/2012  . Hyperlipidemia 04/02/2012  . Vaginal spotting 04/02/2012  . Obesity 04/02/2012  . Lethargy 01/28/2012  . Vulvitis 01/28/2012  . Anxiety   .  Arthritis   . Urge incontinence   . Colon polyps    Current Outpatient Prescriptions on File Prior to Visit  Medication Sig Dispense Refill  . b complex vitamins tablet Take 1 tablet by mouth daily.      . cholecalciferol (VITAMIN D) 1000 UNITS tablet Take 1,000 Units by mouth daily.      . Melatonin 1 MG TABS Take 1 tablet by mouth at bedtime as needed.      . phentermine 37.5 MG capsule Take 37.5 mg by mouth every morning.      . SUMAtriptan (IMITREX) 50 MG tablet Take one tablet at onset of migraine.  May repeat one time only in 2 hours  10 tablet  0  . topiramate (TOPAMAX) 15 MG capsule Take 15 mg by mouth 2 (two) times daily.      . traMADol-acetaminophen (ULTRACET) 37.5-325 MG per tablet Take 1 tablet by mouth every 6 (six) hours as needed for pain.  30 tablet  1   No current facility-administered medications on file prior to visit.     Review of Systems See HPI  Objective:   Physical Exam Physical Exam  Nursing note and vitals reviewed.  Constitutional: She is oriented to person, place, and time. She appears well-developed and well-nourished.  HENT:  Head: Normocephalic and atraumatic.  Cardiovascular: Normal rate and regular rhythm. Exam reveals no gallop and no friction rub.  No murmur heard.  Pulmonary/Chest: Breath sounds normal. She has no wheezes. She has no rales.  Ext  Trace edema on Left  2+ on right and lots of edema to r ankle.  Some mild redness Good bilateral pedal pulses Neurological: She is alert and oriented to person, place, and time.  Skin: Skin is warm and dry.  Psychiatric: She has a normal mood and affect. Her behavior is normal.           Assessment & Plan:  RLE edema  With mild celluliti  Will give Z-pak and to go to xray today for venous u/s  To rule out DVT  Further management based on results  U/S neg for DVT

## 2013-08-04 NOTE — Patient Instructions (Signed)
See me in one week

## 2013-08-05 LAB — VITAMIN D 25 HYDROXY (VIT D DEFICIENCY, FRACTURES): Vit D, 25-Hydroxy: 22 ng/mL — ABNORMAL LOW (ref 30–89)

## 2013-08-05 LAB — TSH: TSH: 0.931 u[IU]/mL (ref 0.350–4.500)

## 2013-08-06 ENCOUNTER — Encounter: Payer: Self-pay | Admitting: *Deleted

## 2013-08-11 ENCOUNTER — Ambulatory Visit (INDEPENDENT_AMBULATORY_CARE_PROVIDER_SITE_OTHER): Payer: 59 | Admitting: Internal Medicine

## 2013-08-11 DIAGNOSIS — I8390 Asymptomatic varicose veins of unspecified lower extremity: Secondary | ICD-10-CM

## 2013-08-11 DIAGNOSIS — R609 Edema, unspecified: Secondary | ICD-10-CM | POA: Insufficient documentation

## 2013-08-11 DIAGNOSIS — Z139 Encounter for screening, unspecified: Secondary | ICD-10-CM

## 2013-08-11 NOTE — Patient Instructions (Signed)
See me as needed  Flu vaccine today

## 2013-08-11 NOTE — Progress Notes (Signed)
Subjective:    Patient ID: Judith Gamble, female    DOB: Aug 10, 1951, 62 y.o.   MRN: [id number]  HPI  Judith Gamble is here for follow up.     She had her colonoscopy yesterday and was told she had two polyps  R leg less edematous and redness is resolved  Doppler neg for DVT  She has a follow up appt with vein clinic soon  Allergies  Allergen Reactions  . Mold Extract [Trichophyton Mentagrophyte]    Past Medical History  Diagnosis Date  . Migraine   . Urge incontinence   . Anxiety   . Arthritis   . Colon polyps   . Vaginal dryness   . Bacterial infection   . Yeast infection   . Pregnancy, ectopic, tubal   . Hx of varicella   . Hx of measles    Past Surgical History  Procedure Laterality Date  . Cholecystectomy  1976  . Tubal ligation    . Vein surgery     History   Social History  . Marital Status: Married    Spouse Name: N/A    Number of Children: N/A  . Years of Education: N/A   Occupational History  . Not on file.   Social History Main Topics  . Smoking status: Former Smoker    Quit date: 10/16/1975  . Smokeless tobacco: Never Used  . Alcohol Use: Yes     Comment: socially  . Drug Use: No  . Sexual Activity: Yes    Birth Control/ Protection: Post-menopausal   Other Topics Concern  . Not on file   Social History Narrative  . No narrative on file   Family History  Problem Relation Age of Onset  . Arthritis Mother   . Diabetes Mother   . Heart disease Brother   . Kidney disease Brother   . Colon cancer Brother 48  . Drug abuse Brother    Patient Active Problem List   Diagnosis Date Noted  . Edema 08/11/2013  . Varicose veins 08/04/2013  . Tinea 11/13/2012  . Urinary urgency 11/13/2012  . UTI (lower urinary tract infection) 08/21/2012  . Vertigo 07/23/2012  . Headache 07/23/2012  . Hyperlipidemia 04/02/2012  . Vaginal spotting 04/02/2012  . Obesity 04/02/2012  . Lethargy 01/28/2012  . Vulvitis 01/28/2012  . Anxiety   . Arthritis    . Urge incontinence   . Colon polyps    Current Outpatient Prescriptions on File Prior to Visit  Medication Sig Dispense Refill  . azithromycin (ZITHROMAX) 250 MG tablet Take as directed  6 tablet  0  . b complex vitamins tablet Take 1 tablet by mouth daily.      . cholecalciferol (VITAMIN D) 1000 UNITS tablet Take 1,000 Units by mouth daily.      . hydrochlorothiazide (HYDRODIURIL) 25 MG tablet Take 1 tablet (25 mg total) by mouth daily.  6 tablet  0  . Melatonin 1 MG TABS Take 1 tablet by mouth at bedtime as needed.      . phentermine 37.5 MG capsule Take 37.5 mg by mouth every morning.      . SUMAtriptan (IMITREX) 50 MG tablet Take one tablet at onset of migraine.  May repeat one time only in 2 hours  10 tablet  0  . topiramate (TOPAMAX) 15 MG capsule Take 15 mg by mouth 2 (two) times daily.      . traMADol-acetaminophen (ULTRACET) 37.5-325 MG per tablet Take 1 tablet by mouth every 6 (six) hours  as needed for pain.  30 tablet  1   No current facility-administered medications on file prior to visit.      Review of Systems See HPI    Objective:   Physical Exam Physical Exam  Nursing note and vitals reviewed.  Constitutional: She is oriented to person, place, and time. She appears well-developed and well-nourished.  HENT:  Head: Normocephalic and atraumatic.  Cardiovascular: Normal rate and regular rhythm. Exam reveals no gallop and no friction rub.  No murmur heard.  Pulmonary/Chest: Breath sounds normal. She has no wheezes. She has no rales.  Neurological: She is alert and oriented to person, place, and time.  Skin: Skin is warm and dry.  LE  Trace edema on R  No erythema Psychiatric: She has a normal mood and affect. Her behavior is normal.         Assessment & Plan:  Edema  Likely venous in nature.  She is to complete her 10 day course of HCTZ then stop.    If problem recurs she is to call office and can consider hctz 2-3 times per week  Flu vaccine today

## 2013-08-14 ENCOUNTER — Other Ambulatory Visit (INDEPENDENT_AMBULATORY_CARE_PROVIDER_SITE_OTHER): Payer: 59 | Admitting: *Deleted

## 2013-08-14 DIAGNOSIS — Z23 Encounter for immunization: Secondary | ICD-10-CM

## 2013-08-20 ENCOUNTER — Encounter: Payer: Self-pay | Admitting: Internal Medicine

## 2013-08-21 ENCOUNTER — Encounter: Payer: Self-pay | Admitting: *Deleted

## 2013-08-25 ENCOUNTER — Encounter: Payer: Self-pay | Admitting: *Deleted

## 2013-09-02 ENCOUNTER — Telehealth: Payer: Self-pay | Admitting: *Deleted

## 2013-09-02 ENCOUNTER — Ambulatory Visit (INDEPENDENT_AMBULATORY_CARE_PROVIDER_SITE_OTHER): Payer: 59 | Admitting: Internal Medicine

## 2013-09-02 ENCOUNTER — Encounter: Payer: Self-pay | Admitting: Internal Medicine

## 2013-09-02 VITALS — BP 117/70 | HR 72 | Temp 98.2°F | Resp 18 | Wt 244.0 lb

## 2013-09-02 DIAGNOSIS — Z23 Encounter for immunization: Secondary | ICD-10-CM

## 2013-09-02 DIAGNOSIS — J209 Acute bronchitis, unspecified: Secondary | ICD-10-CM

## 2013-09-02 DIAGNOSIS — M1990 Unspecified osteoarthritis, unspecified site: Secondary | ICD-10-CM

## 2013-09-02 DIAGNOSIS — F419 Anxiety disorder, unspecified: Secondary | ICD-10-CM

## 2013-09-02 DIAGNOSIS — Z1151 Encounter for screening for human papillomavirus (HPV): Secondary | ICD-10-CM

## 2013-09-02 DIAGNOSIS — E785 Hyperlipidemia, unspecified: Secondary | ICD-10-CM

## 2013-09-02 DIAGNOSIS — F411 Generalized anxiety disorder: Secondary | ICD-10-CM

## 2013-09-02 DIAGNOSIS — Z124 Encounter for screening for malignant neoplasm of cervix: Secondary | ICD-10-CM

## 2013-09-02 DIAGNOSIS — M129 Arthropathy, unspecified: Secondary | ICD-10-CM

## 2013-09-02 DIAGNOSIS — Z0000 Encounter for general adult medical examination without abnormal findings: Secondary | ICD-10-CM

## 2013-09-02 DIAGNOSIS — Z1211 Encounter for screening for malignant neoplasm of colon: Secondary | ICD-10-CM

## 2013-09-02 LAB — POCT URINALYSIS DIPSTICK
Bilirubin, UA: NEGATIVE
Blood, UA: NEGATIVE
Ketones, UA: NEGATIVE
Nitrite, UA: NEGATIVE
Protein, UA: NEGATIVE
pH, UA: 7

## 2013-09-02 LAB — HEMOCCULT GUIAC POC 1CARD (OFFICE)

## 2013-09-02 MED ORDER — HYDROCOD POLST-CHLORPHEN POLST 10-8 MG/5ML PO LQCR: 5.0000 mL | Freq: Two times a day (BID) | ORAL | Status: DC | PRN

## 2013-09-02 MED ORDER — ZOSTER VACCINE LIVE 19400 UNT/0.65ML ~~LOC~~ SOLR: 0.6500 mL | Freq: Once | SUBCUTANEOUS | Status: DC

## 2013-09-02 MED ORDER — AZITHROMYCIN 250 MG PO TABS: ORAL_TABLET | ORAL | Status: DC

## 2013-09-02 NOTE — Progress Notes (Signed)
Subjective:    Patient ID: Judith Gamble, female    DOB: 18-Jun-1951, 62 y.o.   MRN: [id number]  HPI  [name]  Is here for CPE.    Doing well  She was travelling to see her mother in [place] and has a cough that is productive of yellow mucous   No fever or chest pain  No sob  She was found to  Have one adenomatous polyp  On her 2014  Colonoscopy.      She was hit with  A lacrosse ball when playing with her grandson   .  Bruise on L arm now betteer  She did not seek medical attention  Allergies  Allergen Reactions  . Mold Extract [Trichophyton Mentagrophyte]    Past Medical History  Diagnosis Date  . Migraine   . Urge incontinence   . Anxiety   . Arthritis   . Colon polyps   . Vaginal dryness   . Bacterial infection   . Yeast infection   . Pregnancy, ectopic, tubal   . Hx of varicella   . Hx of measles   . Macular degeneration    Past Surgical History  Procedure Laterality Date  . Cholecystectomy  1976  . Tubal ligation    . Vein surgery     History   Social History  . Marital Status: Married    Spouse Name: N/A    Number of Children: N/A  . Years of Education: N/A   Occupational History  . Not on file.   Social History Main Topics  . Smoking status: Former Smoker    Quit date: 10/16/1975  . Smokeless tobacco: Never Used  . Alcohol Use: Yes     Comment: socially  . Drug Use: No  . Sexual Activity: Yes    Birth Control/ Protection: Post-menopausal   Other Topics Concern  . Not on file   Social History Narrative  . No narrative on file   Family History  Problem Relation Age of Onset  . Arthritis Mother   . Diabetes Mother   . Heart disease Brother   . Kidney disease Brother   . Colon cancer Brother 48  . Drug abuse Brother   . Heart disease Sister   . Diabetes Sister   . Hyperlipidemia Sister    Patient Active Problem List   Diagnosis Date Noted  . Edema 08/11/2013  . Varicose veins 08/04/2013  . Tinea 11/13/2012  . Urinary urgency  11/13/2012  . UTI (lower urinary tract infection) 08/21/2012  . Vertigo 07/23/2012  . Headache 07/23/2012  . Hyperlipidemia 04/02/2012  . Vaginal spotting 04/02/2012  . Obesity 04/02/2012  . Lethargy 01/28/2012  . Vulvitis 01/28/2012  . Anxiety   . Arthritis   . Urge incontinence   . Colon polyps    Current Outpatient Prescriptions on File Prior to Visit  Medication Sig Dispense Refill  . b complex vitamins tablet Take 1 tablet by mouth daily.      . cholecalciferol (VITAMIN D) 1000 UNITS tablet Take 1,000 Units by mouth daily.      . hydrochlorothiazide (HYDRODIURIL) 25 MG tablet Take 1 tablet (25 mg total) by mouth daily.  6 tablet  0  . Melatonin 1 MG TABS Take 1 tablet by mouth at bedtime as needed.      . SUMAtriptan (IMITREX) 50 MG tablet Take one tablet at onset of migraine.  May repeat one time only in 2 hours  10 tablet  0  . topiramate (  TOPAMAX) 15 MG capsule Take 15 mg by mouth 2 (two) times daily.      . traMADol-acetaminophen (ULTRACET) 37.5-325 MG per tablet Take 1 tablet by mouth every 6 (six) hours as needed for pain.  30 tablet  1   No current facility-administered medications on file prior to visit.     Review of Systems     Objective:   Physical Exam Physical Exam  Vital signs and nursing note reviewed  Constitutional: She is oriented to person, place, and time. She appears well-developed and well-nourished. She is cooperative.  HENT:  Head: Normocephalic and atraumatic.  Right Ear: Tympanic membrane normal.  Left Ear: Tympanic membrane normal.  Nose: Nose normal.  Mouth/Throat: Oropharynx is clear and moist and mucous membranes are normal. No oropharyngeal exudate or posterior oropharyngeal erythema.  Eyes: Conjunctivae and EOM are normal. Pupils are equal, round, and reactive to light.  Neck: Neck supple. No JVD present. Carotid bruit is not present. No mass and no thyromegaly present.  Cardiovascular: Regular rhythm, normal heart sounds, intact  distal pulses and normal pulses.  Exam reveals no gallop and no friction rub.   No murmur heard. Pulses:      Dorsalis pedis pulses are 2+ on the right side, and 2+ on the left side.  Pulmonary/Chest: Breath sounds normal. She has no wheezes. She has no rhonchi. She has no rales. Right breast exhibits no mass, no nipple discharge and no skin change. Left breast exhibits no mass, no nipple discharge and no skin change.  Abdominal: Soft. Bowel sounds are normal. She exhibits no distension and no mass. There is no hepatosplenomegaly. There is no tenderness. There is no CVA tenderness.  Genitourinary: Rectum normal, vagina normal and uterus normal. Rectal exam shows no mass. Guaiac negative stool. No labial fusion. There is no lesion on the right labia. There is no lesion on the left labia. Cervix exhibits no motion tenderness. Right adnexum displays no mass, no tenderness and no fullness. Left adnexum displays no mass, no tenderness and no fullness. No erythema around the vagina.  Musculoskeletal:       No active synovitis to any joint.    Lymphadenopathy:       Right cervical: No superficial cervical adenopathy present.      Left cervical: No superficial cervical adenopathy present.       Right axillary: No pectoral and no lateral adenopathy present.       Left axillary: No pectoral and no lateral adenopathy present.      Right: No inguinal adenopathy present.       Left: No inguinal adenopathy present.  Neurological: She is alert and oriented to person, place, and time. She has normal strength and normal reflexes. No cranial nerve deficit or sensory deficit. She displays a negative Romberg sign. Coordination and gait normal.  Skin: Skin is warm and dry. No abrasion, no bruising, no ecchymosis and no rash noted. No cyanosis. Nails show no clubbing.  Psychiatric: She has a normal mood and affect. Her speech is normal and behavior is normal.          Assessment & Plan:  Health Maintenance  Will  give Shingles today.  And she is to return in 8 weeks for her pneumovax.  Pap today.  Advised 3D mm   Bronchitis :  Will give Z-pak or Tussionex.   Hyperlipidemia  Recent labs good             Assessment & Plan:

## 2013-09-02 NOTE — Patient Instructions (Addendum)
See  Me as needed  Call Bobbie in January for pneumovax

## 2013-09-03 NOTE — Telephone Encounter (Signed)
Called in [facility]

## 2013-09-06 ENCOUNTER — Encounter: Payer: Self-pay | Admitting: Internal Medicine

## 2013-09-14 ENCOUNTER — Telehealth: Payer: Self-pay | Admitting: *Deleted

## 2013-09-14 NOTE — Telephone Encounter (Signed)
Pap results mailed to pt home address

## 2013-12-30 IMAGING — CT CT HEAD W/O CM
1 series · 16 of 30 positions shown, 20 images · non-contrast
Comparison: None.

CLINICAL DATA: Headache.  Tunnel vision and vertigo.

CT HEAD WITHOUT CONTRAST
TECHNIQUE: Contiguous axial images were obtained from the base of
the skull through the vertex without contrast.

[Series 2: head 4.8 h37s · axial · 0.45mm/px · z∈[-126,+34]mm · 16 of 36 slices shown, 20 images]
[im 2/36  brain]
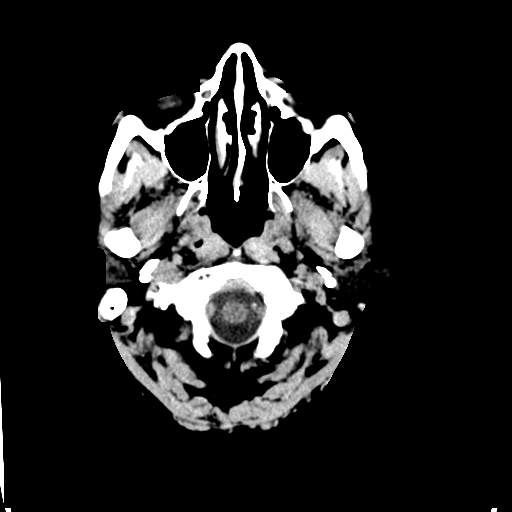
[im 2/36  bone]
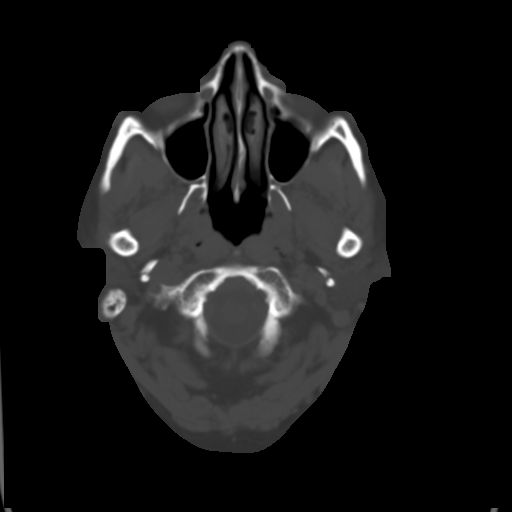
[im 4/36  brain]
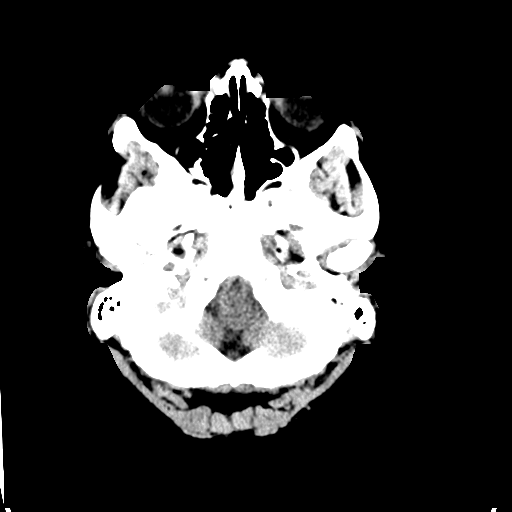
[im 7/36  brain]
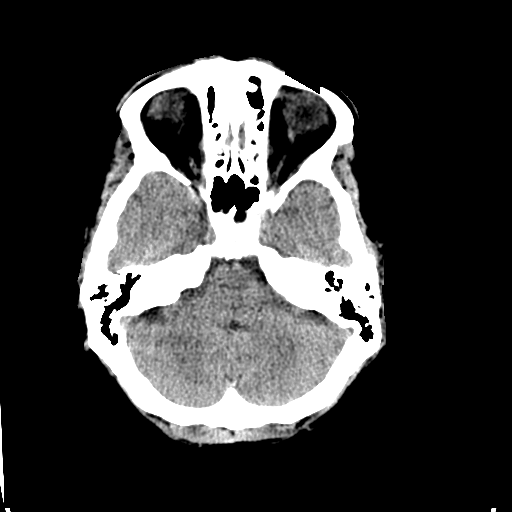
[im 9/36  brain]
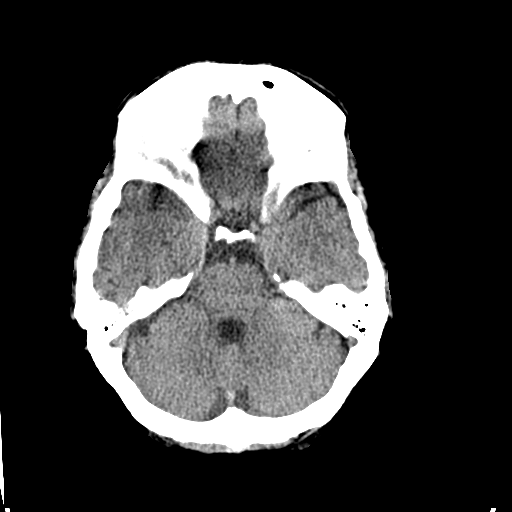
[im 10/36  brain]
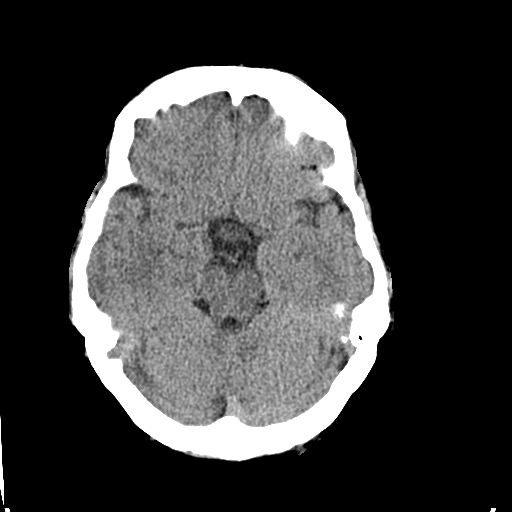
[im 10/36  bone]
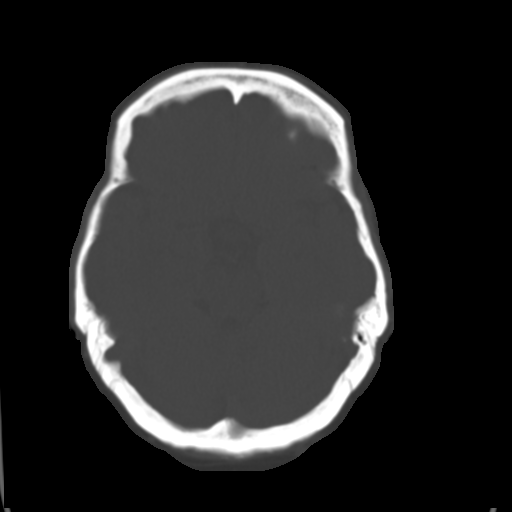
[im 13/36  brain]
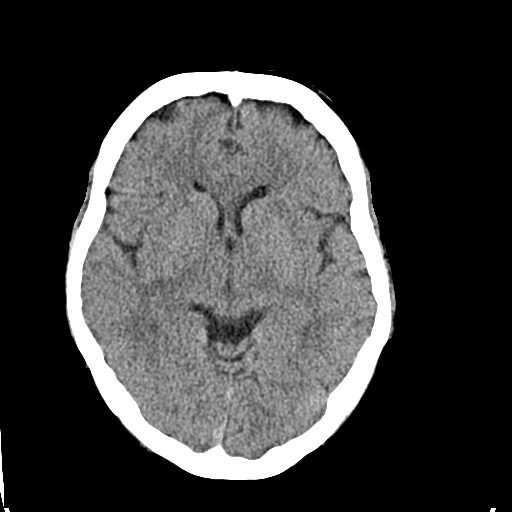
[im 15/36  brain]
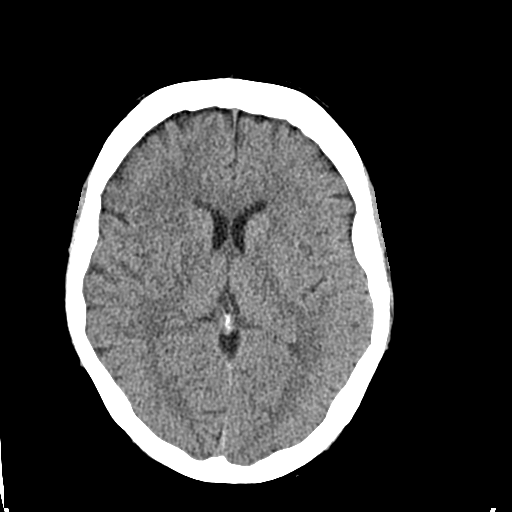
[im 17/36  brain]
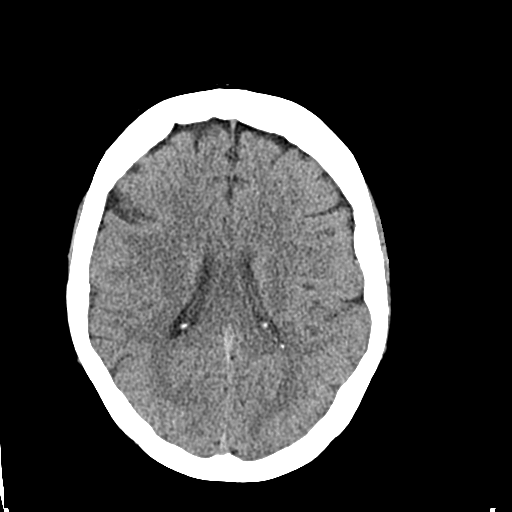
[im 19/36  brain]
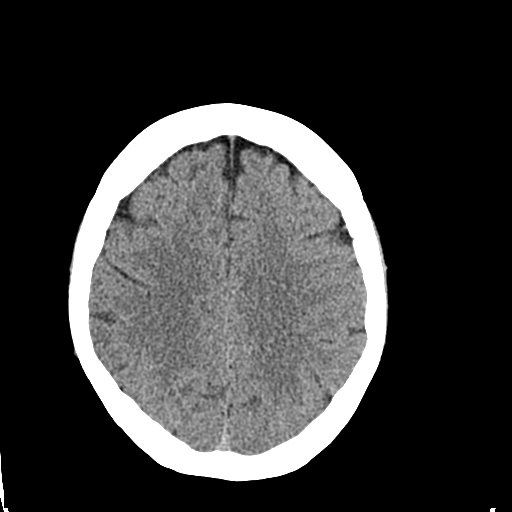
[im 19/36  bone]
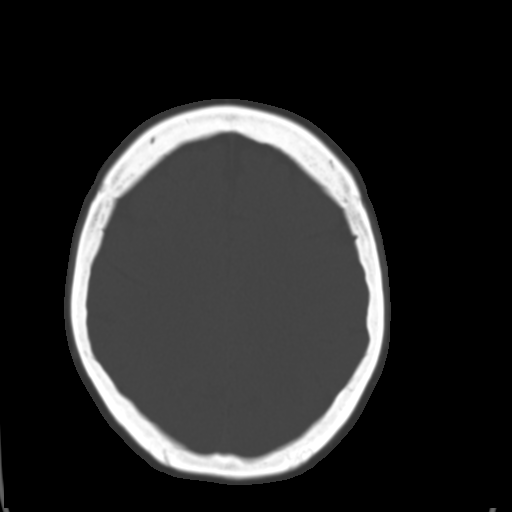
[im 21/36  brain]
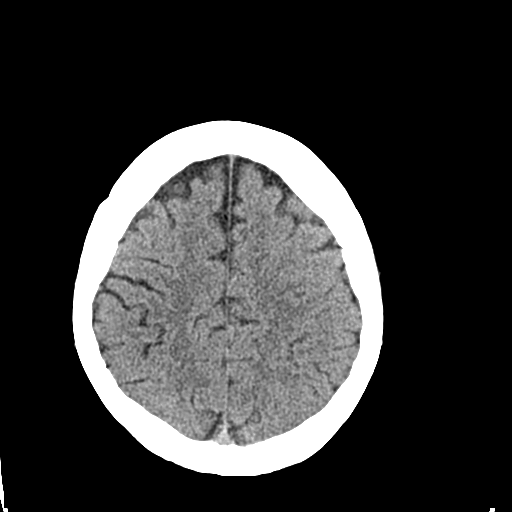
[im 23/36  brain]
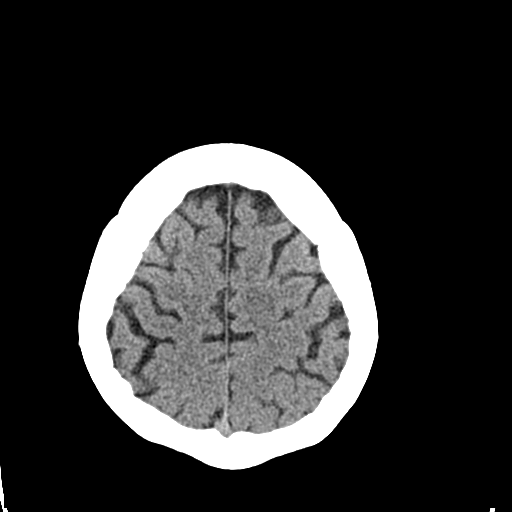
[im 26/36  brain]
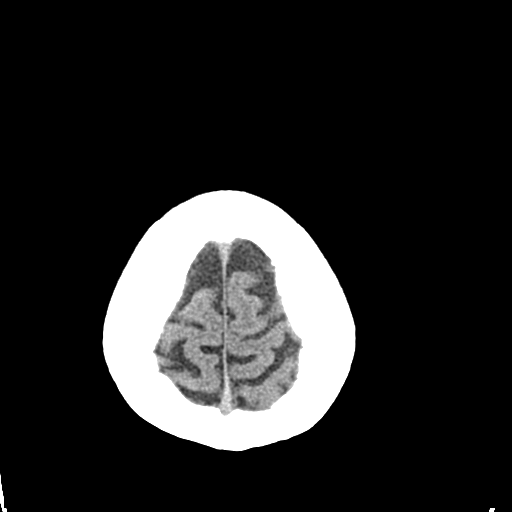
[im 27/36  brain]
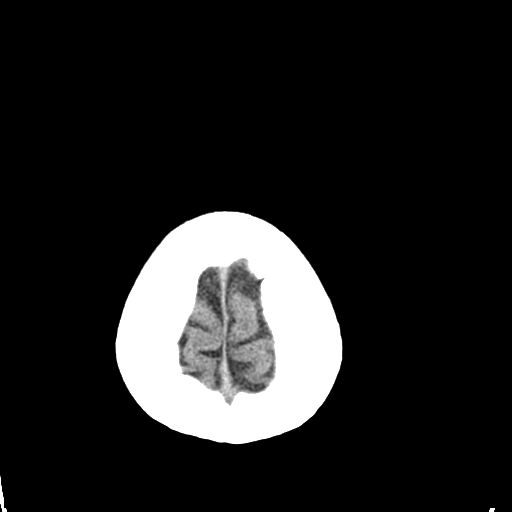
[im 27/36  bone]
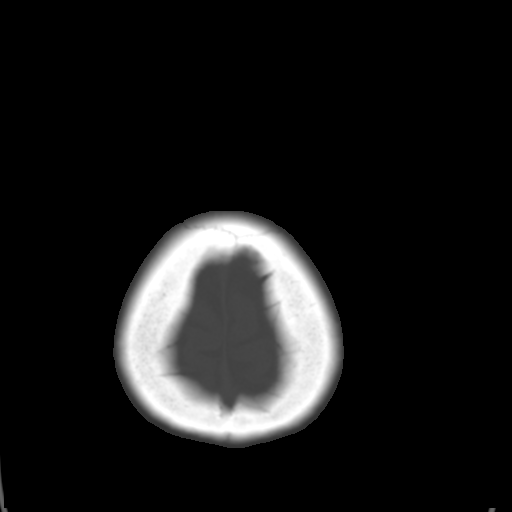
[im 29/36  brain]
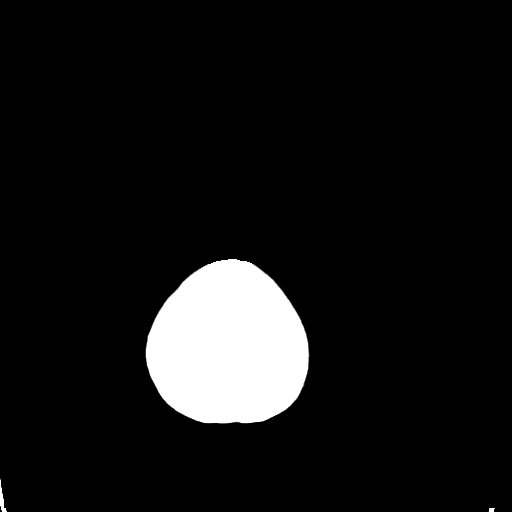
[im 32/36  brain]
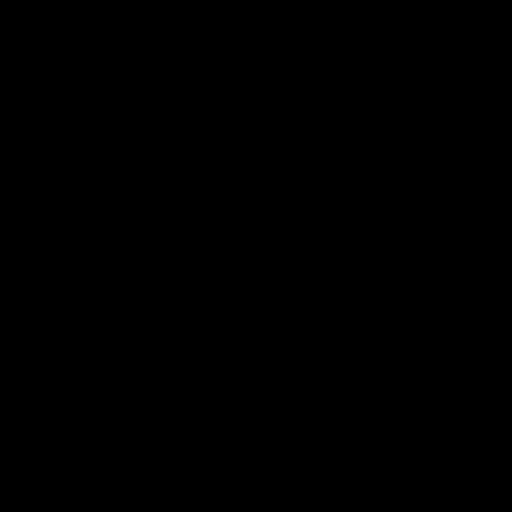
[im 34/36  brain]
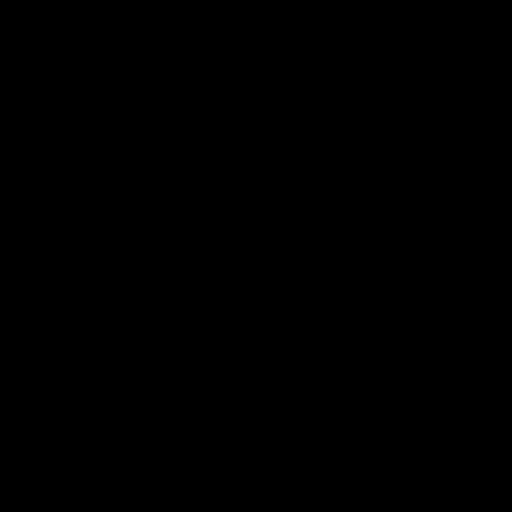

[16 of 30 positions shown; findings below may reference images not displayed]

FINDINGS: No acute intracranial abnormality is present.
Specifically, there is no evidence for acute infarct, hemorrhage,
mass, hydrocephalus, or extra-axial fluid collection.  The
paranasal sinuses and mastoid air cells are clear.  The globes and
orbits are intact.  The osseous skull is intact.
IMPRESSION: Negative CT of the head.

## 2014-04-14 ENCOUNTER — Encounter: Payer: Self-pay | Admitting: Internal Medicine

## 2014-04-14 ENCOUNTER — Ambulatory Visit (INDEPENDENT_AMBULATORY_CARE_PROVIDER_SITE_OTHER): Payer: 59 | Admitting: Internal Medicine

## 2014-04-14 VITALS — BP 117/73 | HR 71 | Resp 16 | Ht 64.5 in | Wt 233.0 lb

## 2014-04-14 DIAGNOSIS — N3000 Acute cystitis without hematuria: Secondary | ICD-10-CM

## 2014-04-14 DIAGNOSIS — R350 Frequency of micturition: Secondary | ICD-10-CM

## 2014-04-14 LAB — POCT URINALYSIS DIPSTICK
BILIRUBIN UA: NEGATIVE
Blood, UA: NEGATIVE
Glucose, UA: NEGATIVE
Ketones, UA: NEGATIVE
Nitrite, UA: NEGATIVE
PROTEIN UA: NEGATIVE
Spec Grav, UA: 1.015
Urobilinogen, UA: NEGATIVE
pH, UA: 5

## 2014-04-14 MED ORDER — CIPROFLOXACIN HCL 500 MG PO TABS: 500.0000 mg | ORAL_TABLET | Freq: Two times a day (BID) | ORAL | Status: DC

## 2014-04-14 NOTE — Progress Notes (Signed)
Subjective:    Patient ID: Judith Gamble, female    DOB: [birth year], 63 y.o.   MRN: [id number]  HPI Mozetta is here for acute visit.  3 weeks of frequency urgency and dysuria.  NO fever no flank pain.     Allergies  Allergen Reactions  . Mold Extract [Trichophyton Mentagrophyte]    Past Medical History  Diagnosis Date  . Migraine   . Urge incontinence   . Anxiety   . Arthritis   . Colon polyps   . Vaginal dryness   . Bacterial infection   . Yeast infection   . Pregnancy, ectopic, tubal   . Hx of varicella   . Hx of measles   . Macular degeneration    Past Surgical History  Procedure Laterality Date  . Cholecystectomy  1976  . Tubal ligation    . Vein surgery     History   Social History  . Marital Status: Married    Spouse Name: N/A    Number of Children: N/A  . Years of Education: N/A   Occupational History  . Not on file.   Social History Main Topics  . Smoking status: Former Smoker    Quit date: 10/16/1975  . Smokeless tobacco: Never Used  . Alcohol Use: Yes     Comment: socially  . Drug Use: No  . Sexual Activity: Yes    Birth Control/ Protection: Post-menopausal   Other Topics Concern  . Not on file   Social History Narrative  . No narrative on file   Family History  Problem Relation Age of Onset  . Arthritis Mother   . Diabetes Mother   . Heart disease Brother   . Kidney disease Brother   . Colon cancer Brother 97  . Drug abuse Brother   . Heart disease Sister   . Diabetes Sister   . Hyperlipidemia Sister    Patient Active Problem List   Diagnosis Date Noted  . Edema 08/11/2013  . Varicose veins 08/04/2013  . Tinea 11/13/2012  . Urinary urgency 11/13/2012  . UTI (lower urinary tract infection) 08/21/2012  . Vertigo 07/23/2012  . Headache 07/23/2012  . Hyperlipidemia 04/02/2012  . Vaginal spotting 04/02/2012  . Obesity 04/02/2012  . Lethargy 01/28/2012  . Vulvitis 01/28/2012  . Anxiety   . Arthritis   . Urge  incontinence   . Colon polyps    Current Outpatient Prescriptions on File Prior to Visit  Medication Sig Dispense Refill  . b complex vitamins tablet Take 1 tablet by mouth daily.      . Calcium Carbonate-Vitamin D (CALCIUM + D PO) Take 1 tablet by mouth.      . cholecalciferol (VITAMIN D) 1000 UNITS tablet Take 1,000 Units by mouth daily.      . Misc Natural Products (LUTEIN VISION BLEND PO) Take 1 tablet by mouth.       No current facility-administered medications on file prior to visit.       Review of Systems See HPI    Objective:   Physical Exam Physical Exam  Nursing note and vitals reviewed.  Constitutional: She is oriented to person, place, and time. She appears well-developed and well-nourished.  HENT:  Head: Normocephalic and atraumatic.  Cardiovascular: Normal rate and regular rhythm. Exam reveals no gallop and no friction rub.  No murmur heard.  Pulmonary/Chest: Breath sounds normal. She has no wheezes. She has no rales.  ABD:  No CVA tenderness BS + no peritoneal signs .  Pos suprapubic tenderness Neurological: She is alert and oriented to person, place, and time.  Skin: Skin is warm and dry.  Psychiatric: She has a normal mood and affect. Her behavior is normal.              Assessment & Plan:  UTI  willl send culture   Cipro 500 mg bid 5 days

## 2014-04-19 LAB — CULTURE, URINE COMPREHENSIVE: Colony Count: 95000

## 2014-04-20 ENCOUNTER — Encounter: Payer: Self-pay | Admitting: Internal Medicine

## 2014-04-27 ENCOUNTER — Encounter: Payer: Self-pay | Admitting: Internal Medicine

## 2014-04-27 ENCOUNTER — Ambulatory Visit (INDEPENDENT_AMBULATORY_CARE_PROVIDER_SITE_OTHER): Payer: 59 | Admitting: Internal Medicine

## 2014-04-27 VITALS — BP 111/73 | HR 77 | Resp 16 | Ht 64.0 in | Wt 238.0 lb

## 2014-04-27 DIAGNOSIS — M546 Pain in thoracic spine: Secondary | ICD-10-CM

## 2014-04-27 DIAGNOSIS — N390 Urinary tract infection, site not specified: Secondary | ICD-10-CM

## 2014-04-27 MED ORDER — IBUPROFEN 800 MG PO TABS: ORAL_TABLET | ORAL | Status: AC

## 2014-04-27 NOTE — Progress Notes (Signed)
Subjective:    Patient ID: Judith Gamble, female    DOB: Apr 10, 1951, 63 y.o.   MRN: [id number]  HPI  Judith Gamble is here for follow up.    She finished Cipro .  All bladder symptoms improved   She pulled her back muscle with water exercises.  It easily reproduces  When she moves   Allergies  Allergen Reactions  . Mold Extract [Trichophyton Mentagrophyte]    Past Medical History  Diagnosis Date  . Migraine   . Urge incontinence   . Anxiety   . Arthritis   . Colon polyps   . Vaginal dryness   . Bacterial infection   . Yeast infection   . Pregnancy, ectopic, tubal   . Hx of varicella   . Hx of measles   . Macular degeneration    Past Surgical History  Procedure Laterality Date  . Cholecystectomy  1976  . Tubal ligation    . Vein surgery     History   Social History  . Marital Status: Married    Spouse Name: N/A    Number of Children: N/A  . Years of Education: N/A   Occupational History  . Not on file.   Social History Main Topics  . Smoking status: Former Smoker    Quit date: 10/16/1975  . Smokeless tobacco: Never Used  . Alcohol Use: Yes     Comment: socially  . Drug Use: No  . Sexual Activity: Yes    Birth Control/ Protection: Post-menopausal   Other Topics Concern  . Not on file   Social History Narrative  . No narrative on file   Family History  Problem Relation Age of Onset  . Arthritis Mother   . Diabetes Mother   . Heart disease Brother   . Kidney disease Brother   . Colon cancer Brother 48  . Drug abuse Brother   . Heart disease Sister   . Diabetes Sister   . Hyperlipidemia Sister    Patient Active Problem List   Diagnosis Date Noted  . Edema 08/11/2013  . Varicose veins 08/04/2013  . Tinea 11/13/2012  . Urinary urgency 11/13/2012  . UTI (lower urinary tract infection) 08/21/2012  . Vertigo 07/23/2012  . Headache 07/23/2012  . Hyperlipidemia 04/02/2012  . Vaginal spotting 04/02/2012  . Obesity 04/02/2012  . Lethargy  01/28/2012  . Vulvitis 01/28/2012  . Anxiety   . Arthritis   . Urge incontinence   . Colon polyps    Current Outpatient Prescriptions on File Prior to Visit  Medication Sig Dispense Refill  . b complex vitamins tablet Take 1 tablet by mouth daily.      . Calcium Carbonate-Vitamin D (CALCIUM + D PO) Take 1 tablet by mouth.      . cholecalciferol (VITAMIN D) 1000 UNITS tablet Take 1,000 Units by mouth daily.      Marland Kitchen estradiol (ESTRACE) 0.1 MG/GM vaginal cream Place 1 Applicatorful vaginally at bedtime.      . Misc Natural Products (LUTEIN VISION BLEND PO) Take 1 tablet by mouth.       No current facility-administered medications on file prior to visit.      Review of Systems    see HPI Objective:   Physical Exam  Physical Exam  Nursing note and vitals reviewed.  Constitutional: She is oriented to person, place, and time. She appears well-developed and well-nourished.  HENT:  Head: Normocephalic and atraumatic.  Cardiovascular: Normal rate and regular rhythm. Exam reveals no gallop  and no friction rub.  No murmur heard.  Pulmonary/Chest: Breath sounds normal. She has no wheezes. She has no rales.  Neurological: She is alert and oriented to person, place, and time.  Skin: Skin is warm and dry.  Neurologic Le  Normal reflexes and motor strength Psychiatric: She has a normal mood and affect. Her behavior is normal.         Assessment & Plan:  UTI  U/A normal     muslce strain  [facility] for ibuprofen 800 mg bid    Call if not better

## 2014-08-16 ENCOUNTER — Encounter: Payer: Self-pay | Admitting: Internal Medicine

## 2014-11-25 ENCOUNTER — Encounter: Payer: Self-pay | Admitting: Internal Medicine

## 2014-11-25 ENCOUNTER — Ambulatory Visit (INDEPENDENT_AMBULATORY_CARE_PROVIDER_SITE_OTHER): Payer: 59 | Admitting: Internal Medicine

## 2014-11-25 VITALS — BP 129/74 | HR 85 | Resp 16 | Ht 64.0 in | Wt 243.0 lb

## 2014-11-25 DIAGNOSIS — N390 Urinary tract infection, site not specified: Secondary | ICD-10-CM | POA: Diagnosis not present

## 2014-11-25 DIAGNOSIS — R82998 Other abnormal findings in urine: Secondary | ICD-10-CM

## 2014-11-25 DIAGNOSIS — Z23 Encounter for immunization: Secondary | ICD-10-CM | POA: Diagnosis not present

## 2014-11-25 MED ORDER — CIPROFLOXACIN HCL 500 MG PO TABS: 500.0000 mg | ORAL_TABLET | Freq: Two times a day (BID) | ORAL | Status: DC

## 2014-11-25 NOTE — Progress Notes (Signed)
Subjective:    Patient ID: Judith Gamble, female    DOB: [birth year], 64 y.o.   MRN: [id number]  HPI Judith Gamble is here for acute visit.    6-7 days of urinary urgency,  Dysuria  No fever  No CVA pain no N/V   Allergies  Allergen Reactions  . Mold Extract [Trichophyton Mentagrophyte]    Past Medical History  Diagnosis Date  . Migraine   . Urge incontinence   . Anxiety   . Arthritis   . Colon polyps   . Vaginal dryness   . Bacterial infection   . Yeast infection   . Pregnancy, ectopic, tubal   . Hx of varicella   . Hx of measles   . Macular degeneration    Past Surgical History  Procedure Laterality Date  . Cholecystectomy  1976  . Tubal ligation    . Vein surgery     History   Social History  . Marital Status: Married    Spouse Name: N/A  . Number of Children: N/A  . Years of Education: N/A   Occupational History  . Not on file.   Social History Main Topics  . Smoking status: Former Smoker    Quit date: 10/16/1975  . Smokeless tobacco: Never Used  . Alcohol Use: Yes     Comment: socially  . Drug Use: No  . Sexual Activity: Yes    Birth Control/ Protection: Post-menopausal   Other Topics Concern  . Not on file   Social History Narrative   Family History  Problem Relation Age of Onset  . Arthritis Mother   . Diabetes Mother   . Heart disease Brother   . Kidney disease Brother   . Colon cancer Brother 60  . Drug abuse Brother   . Heart disease Sister   . Diabetes Sister   . Hyperlipidemia Sister    Patient Active Problem List   Diagnosis Date Noted  . Edema 08/11/2013  . Varicose veins 08/04/2013  . Tinea 11/13/2012  . Urinary urgency 11/13/2012  . UTI (lower urinary tract infection) 08/21/2012  . Vertigo 07/23/2012  . Headache 07/23/2012  . Hyperlipidemia 04/02/2012  . Vaginal spotting 04/02/2012  . Obesity 04/02/2012  . Lethargy 01/28/2012  . Vulvitis 01/28/2012  . Anxiety   . Arthritis   . Urge incontinence   . Colon polyps     Current Outpatient Prescriptions on File Prior to Visit  Medication Sig Dispense Refill  . b complex vitamins tablet Take 1 tablet by mouth daily.    . Calcium Carbonate-Vitamin D (CALCIUM + D PO) Take 1 tablet by mouth.    . cholecalciferol (VITAMIN D) 1000 UNITS tablet Take 1,000 Units by mouth daily.    Marland Kitchen estradiol (ESTRACE) 0.1 MG/GM vaginal cream Place 1 Applicatorful vaginally at bedtime.    Marland Kitchen ibuprofen (ADVIL,MOTRIN) 800 MG tablet Take one bid for 7 days 30 tablet 0  . Misc Natural Products (LUTEIN VISION BLEND PO) Take 1 tablet by mouth.     No current facility-administered medications on file prior to visit.       Review of Systems See HPI    Objective:   Physical Exam Physical Exam  Nursing note and vitals reviewed.  Constitutional: She is oriented to person, place, and time. She appears well-developed and well-nourished.  HENT:  Head: Normocephalic and atraumatic.  Cardiovascular: Normal rate and regular rhythm. Exam reveals no gallop and no friction rub.  No murmur heard.  Pulmonary/Chest: Breath sounds normal.  She has no wheezes. She has no rales.  Abd:    No CVA tenderness   Neurological: She is alert and oriented to person, place, and time.  Skin: Skin is warm and dry.  Psychiatric: She has a normal mood and affect. Her behavior is normal.       Assessment & Plan:  UTI  Will give 3 day course of Cipro   Urgency Scherry Ran

## 2014-11-27 LAB — URINE CULTURE

## 2015-01-04 ENCOUNTER — Encounter: Payer: Self-pay | Admitting: Internal Medicine

## 2015-01-04 ENCOUNTER — Ambulatory Visit (INDEPENDENT_AMBULATORY_CARE_PROVIDER_SITE_OTHER): Payer: 59 | Admitting: Internal Medicine

## 2015-01-04 VITALS — BP 132/66 | HR 71 | Resp 16 | Ht 64.0 in | Wt 245.0 lb

## 2015-01-04 DIAGNOSIS — J0100 Acute maxillary sinusitis, unspecified: Secondary | ICD-10-CM | POA: Diagnosis not present

## 2015-01-04 MED ORDER — AZITHROMYCIN 250 MG PO TABS: ORAL_TABLET | ORAL | Status: DC

## 2015-01-04 NOTE — Progress Notes (Signed)
Subjective:    Patient ID: Judith Gamble, female    DOB: November 10, 1950, 64 y.o.   MRN: [id number]  HPI 11/25/2014 note Assessment & Plan:  UTI Will give 3 day course of Cipro   Urgency /dysuria           TODAy  7 days congestion  R maxillary sinus pain  No fever sore throat gone   Allergies  Allergen Reactions  . Mold Extract [Trichophyton Mentagrophyte]    Past Medical History  Diagnosis Date  . Migraine   . Urge incontinence   . Anxiety   . Arthritis   . Colon polyps   . Vaginal dryness   . Bacterial infection   . Yeast infection   . Pregnancy, ectopic, tubal   . Hx of varicella   . Hx of measles   . Macular degeneration    Past Surgical History  Procedure Laterality Date  . Cholecystectomy  1976  . Tubal ligation    . Vein surgery     History   Social History  . Marital Status: Married    Spouse Name: N/A  . Number of Children: N/A  . Years of Education: N/A   Occupational History  . Not on file.   Social History Main Topics  . Smoking status: Former Smoker    Quit date: 10/16/1975  . Smokeless tobacco: Never Used  . Alcohol Use: Yes     Comment: socially  . Drug Use: No  . Sexual Activity: Yes    Birth Control/ Protection: Post-menopausal   Other Topics Concern  . Not on file   Social History Narrative   Family History  Problem Relation Age of Onset  . Arthritis Mother   . Diabetes Mother   . Heart disease Brother   . Kidney disease Brother   . Colon cancer Brother 33  . Drug abuse Brother   . Heart disease Sister   . Diabetes Sister   . Hyperlipidemia Sister    Patient Active Problem List   Diagnosis Date Noted  . Edema 08/11/2013  . Varicose veins 08/04/2013  . Tinea 11/13/2012  . Urinary urgency 11/13/2012  . UTI (lower urinary tract infection) 08/21/2012  . Vertigo 07/23/2012  . Headache 07/23/2012  . Hyperlipidemia 04/02/2012  . Vaginal spotting 04/02/2012  . Obesity 04/02/2012  . Lethargy 01/28/2012  .  Vulvitis 01/28/2012  . Anxiety   . Arthritis   . Urge incontinence   . Colon polyps    Current Outpatient Prescriptions on File Prior to Visit  Medication Sig Dispense Refill  . b complex vitamins tablet Take 1 tablet by mouth daily.    . Calcium Carbonate-Vitamin D (CALCIUM + D PO) Take 1 tablet by mouth.    . cholecalciferol (VITAMIN D) 1000 UNITS tablet Take 1,000 Units by mouth daily.    . ciprofloxacin (CIPRO) 500 MG tablet Take 1 tablet (500 mg total) by mouth 2 (two) times daily. 6 tablet 0  . estradiol (ESTRACE) 0.1 MG/GM vaginal cream Place 1 Applicatorful vaginally at bedtime.    Marland Kitchen ibuprofen (ADVIL,MOTRIN) 800 MG tablet Take one bid for 7 days 30 tablet 0  . Misc Natural Products (LUTEIN VISION BLEND PO) Take 1 tablet by mouth.     No current facility-administered medications on file prior to visit.       Review of Systems See HPI    Objective:   Physical Exam Physical Exam  Constitutional: She is oriented to person, place, and time. She  appears well-developed and well-nourished. She is cooperative.  HENT:  Head: Normocephalic and atraumatic.  Right Ear: A middle ear effusion is present.  Left Ear: A middle ear effusion is present.  Nose: Mucosal edema present. Right sinus exhibits maxillary sinus tenderness. Left sinus exhibits maxillary sinus tenderness.  Mouth/Throat: Posterior oropharyngeal erythema present.  Serous effusion bilaterally  Eyes: Conjunctivae and EOM are normal. Pupils are equal, round, and reactive to light.  Neck: Neck supple. Carotid bruit is not present. No mass present.  Cardiovascular: Regular rhythm, normal heart sounds, intact distal pulses and normal pulses. Exam reveals no gallop and no friction rub.  No murmur heard.  Pulmonary/Chest: Breath sounds normal. She has no wheezes. She has no rhonchi. She has no rales.  Neurological: She is alert and oriented to person, place, and time.  Skin: Skin is warm and dry. No abrasion, no bruising,  no ecchymosis and no rash noted. No cyanosis. Nails show no clubbing.  Psychiatric: She has a normal mood and affect. Her speech is normal and behavior is normal.                  Assessment & Plan:  Sinusitis   Z-pak

## 2015-01-06 ENCOUNTER — Encounter (HOSPITAL_COMMUNITY): Payer: Self-pay | Admitting: Emergency Medicine

## 2015-01-06 ENCOUNTER — Other Ambulatory Visit (HOSPITAL_COMMUNITY): Payer: 59

## 2015-01-06 ENCOUNTER — Emergency Department (HOSPITAL_COMMUNITY): Payer: 59

## 2015-01-06 ENCOUNTER — Emergency Department (HOSPITAL_COMMUNITY)
Admission: EM | Admit: 2015-01-06 | Discharge: 2015-01-06 | Disposition: A | Discharge status: Home / Self Care | Payer: 59 | Attending: Emergency Medicine | Admitting: Emergency Medicine

## 2015-01-06 DIAGNOSIS — S92512A Displaced fracture of proximal phalanx of left lesser toe(s), initial encounter for closed fracture: Secondary | ICD-10-CM | POA: Insufficient documentation

## 2015-01-06 DIAGNOSIS — S99922A Unspecified injury of left foot, initial encounter: Secondary | ICD-10-CM | POA: Diagnosis present

## 2015-01-06 DIAGNOSIS — Z86010 Personal history of colonic polyps: Secondary | ICD-10-CM | POA: Insufficient documentation

## 2015-01-06 DIAGNOSIS — Z8659 Personal history of other mental and behavioral disorders: Secondary | ICD-10-CM | POA: Insufficient documentation

## 2015-01-06 DIAGNOSIS — Y9289 Other specified places as the place of occurrence of the external cause: Secondary | ICD-10-CM | POA: Diagnosis not present

## 2015-01-06 DIAGNOSIS — Z8739 Personal history of other diseases of the musculoskeletal system and connective tissue: Secondary | ICD-10-CM | POA: Diagnosis not present

## 2015-01-06 DIAGNOSIS — Z8619 Personal history of other infectious and parasitic diseases: Secondary | ICD-10-CM | POA: Insufficient documentation

## 2015-01-06 DIAGNOSIS — Y998 Other external cause status: Secondary | ICD-10-CM | POA: Diagnosis not present

## 2015-01-06 DIAGNOSIS — Y9301 Activity, walking, marching and hiking: Secondary | ICD-10-CM | POA: Insufficient documentation

## 2015-01-06 DIAGNOSIS — S92912A Unspecified fracture of left toe(s), initial encounter for closed fracture: Secondary | ICD-10-CM

## 2015-01-06 DIAGNOSIS — S93105A Unspecified dislocation of left toe(s), initial encounter: Secondary | ICD-10-CM

## 2015-01-06 DIAGNOSIS — W01198A Fall on same level from slipping, tripping and stumbling with subsequent striking against other object, initial encounter: Secondary | ICD-10-CM | POA: Diagnosis not present

## 2015-01-06 DIAGNOSIS — Z8669 Personal history of other diseases of the nervous system and sense organs: Secondary | ICD-10-CM | POA: Diagnosis not present

## 2015-01-06 DIAGNOSIS — Z87891 Personal history of nicotine dependence: Secondary | ICD-10-CM | POA: Diagnosis not present

## 2015-01-06 DIAGNOSIS — Z79899 Other long term (current) drug therapy: Secondary | ICD-10-CM | POA: Diagnosis not present

## 2015-01-06 DIAGNOSIS — Z87448 Personal history of other diseases of urinary system: Secondary | ICD-10-CM | POA: Diagnosis not present

## 2015-01-06 MED ORDER — LIDOCAINE HCL 2 % IJ SOLN
10.0000 mL | Freq: Once | INTRAMUSCULAR | Status: DC
  Filled 2015-01-06: qty 20

## 2015-01-06 MED ORDER — NAPROXEN 500 MG PO TABS: 500.0000 mg | ORAL_TABLET | Freq: Two times a day (BID) | ORAL | Status: AC

## 2015-01-06 NOTE — ED Notes (Signed)
Pt tripped this morning, stubbed her left foot, caught herself from falling with her wrists. Denies wrist pain, c/o 10/10 toe pain, left second toe is misaligned.

## 2015-01-06 NOTE — ED Provider Notes (Signed)
CSN: [id number]     Arrival date & time 01/06/15  0347 History   First MD Initiated Contact with Patient 01/06/15 [phone]     Chief Complaint  Patient presents with  . Toe Injury   Judith Gamble is a 64 y.o. female  Presents to the emergency department complaining of left second toe pain after stubbing her toe this morning. Patient reports she was walking barefoot when she stubbed her left toe in the bathroom. She notes it was deformed. She complains of  10 out of 10 second left toe pain.  She reports she did not fall and caught herself on the sink prior to falling. Patient denies head injury, loss of consciousness, dizziness or lightheadedness.  She denies previous injury to her left foot or toe. She reports taking some ibuprofen earlier today with little relief.  Denies fevers, chills, falling, head injury, loss of consciousness, dizziness, neck pain, back pain or lightheadedness.  (Consider location/radiation/quality/duration/timing/severity/associated sxs/prior Treatment) HPI  Past Medical History  Diagnosis Date  . Migraine   . Urge incontinence   . Anxiety   . Arthritis   . Colon polyps   . Vaginal dryness   . Bacterial infection   . Yeast infection   . Pregnancy, ectopic, tubal   . Hx of varicella   . Hx of measles   . Macular degeneration    Past Surgical History  Procedure Laterality Date  . Cholecystectomy  1976  . Tubal ligation    . Vein surgery     Family History  Problem Relation Age of Onset  . Arthritis Mother   . Diabetes Mother   . Heart disease Brother   . Kidney disease Brother   . Colon cancer Brother 22  . Drug abuse Brother   . Heart disease Sister   . Diabetes Sister   . Hyperlipidemia Sister    History  Substance Use Topics  . Smoking status: Former Smoker    Quit date: 10/16/1975  . Smokeless tobacco: Never Used  . Alcohol Use: Yes     Comment: socially   OB History    Gravida Para Term Preterm AB TAB SAB Ectopic Multiple Living   3  2   1   1        Review of Systems  Constitutional: Negative for fever.  HENT: Negative for ear pain.   Eyes: Negative for pain and visual disturbance.  Gastrointestinal: Negative for nausea, vomiting and abdominal pain.  Musculoskeletal: Negative for back pain and neck pain.       Left toe pain  Skin: Negative for rash.  Neurological: Negative for dizziness, syncope, light-headedness, numbness and headaches.      Allergies  Mold extract  Home Medications   Prior to Admission medications   Medication Sig Start Date End Date Taking? Authorizing Provider  azithromycin (ZITHROMAX) 250 MG tablet Take as directed 01/04/15   Lanice Shirts, MD  b complex vitamins tablet Take 1 tablet by mouth daily.    Historical Provider, MD  Calcium Carbonate-Vitamin D (CALCIUM + D PO) Take 1 tablet by mouth.    Historical Provider, MD  cholecalciferol (VITAMIN D) 1000 UNITS tablet Take 1,000 Units by mouth daily.    Historical Provider, MD  estradiol (ESTRACE) 0.1 MG/GM vaginal cream Place 1 Applicatorful vaginally at bedtime.    Historical Provider, MD  ibuprofen (ADVIL,MOTRIN) 800 MG tablet Take one bid for 7 days 04/27/14   Lanice Shirts, MD  Misc Natural Products ([place]  BLEND PO) Take 1 tablet by mouth.    Historical Provider, MD  naproxen (NAPROSYN) 500 MG tablet Take 1 tablet (500 mg total) by mouth 2 (two) times daily with a meal. 01/06/15   Waynetta Pean, PA-C   BP 115/67 mmHg  Pulse 81  Temp(Src) 98.1 F (36.7 C) (Oral)  Resp 16  SpO2 96% Physical Exam  Constitutional: She is oriented to person, place, and time. She appears well-developed and well-nourished. No distress.  HENT:  Head: Normocephalic and atraumatic.  Eyes: Conjunctivae are normal. Pupils are equal, round, and reactive to light. Right eye exhibits no discharge. Left eye exhibits no discharge.  Cardiovascular: Normal rate, regular rhythm, normal heart sounds and intact distal pulses.   Bilateral radial,  posterior tibialis and dorsalis pedis pulses are intact.   Pulmonary/Chest: Effort normal and breath sounds normal. No respiratory distress. She has no wheezes. She has no rales.  Abdominal: Soft. There is no tenderness.  Musculoskeletal:  Left second toe is dislocated and tender to palpation. There is no foot tenderness. Good capillary refill. No open skin.   Neurological: She is alert and oriented to person, place, and time. Coordination normal.  Skin: Skin is warm and dry. No rash noted. She is not diaphoretic. No erythema. No pallor.  Psychiatric: She has a normal mood and affect. Her behavior is normal.  Nursing note and vitals reviewed.   ED Course  Procedures (including critical care time) Labs Review Labs Reviewed - No data to display  Imaging Review Dg Foot Complete Left  01/06/2015   CLINICAL DATA:  64 year old female with left second toe pain after jamming toe into shelving  EXAM: LEFT FOOT - COMPLETE 3+ VIEW  COMPARISON:  None.  FINDINGS: Acute displaced fracture through the midportion of the proximal phalanx of the second toe. The distal fracture fragment is displaced dorsally and medially. The joints remain congruent. Normal bony mineralization for age. No lytic or blastic osseous lesion. No significant degenerative change. Plantar calcaneal spurring noted incidentally.  IMPRESSION: Acute displaced fracture through the midportion of the proximal second phalanx. The distal fracture fragment is displaced dorsally and medially.   Electronically Signed   By: Jacqulynn Cadet M.D.   On: 01/06/2015 07:46     EKG Interpretation None      Filed Vitals:   01/06/15 0729 01/06/15 0732  BP: 115/67   Pulse: 81   Temp:  98.1 F (36.7 C)  TempSrc:  Oral  Resp:  16  SpO2: 96%     NERVE BLOCK Performed by: [employer]  Consent: Verbal consent obtained. Required items: required blood products, implants, devices, and special equipment available Time out: Immediately prior to  procedure a "time out" was called to verify the correct patient, procedure, equipment, support staff and site/side marked as required.  Indication: dislocation Nerve block body site: Left second Toe  Preparation: Patient was prepped and draped in the usual sterile fashion. Needle gauge: 24 G Location technique: anatomical landmarks  Local anesthetic: 2% lidocaine without epi.   Anesthetic total: 3 ml  Outcome: pain improved Patient tolerance: Patient tolerated the procedure well with no immediate complications.  Reduction of dislocation Date/Time: 8:12 AM Performed by: Hanley Hays Authorized by: Hanley Hays Consent: Verbal consent obtained. Risks and benefits: risks, benefits and alternatives were discussed Consent given by: patient Required items: required blood products, implants, devices, and special equipment available Time out: Immediately prior to procedure a "time out" was called to verify the correct patient, procedure, equipment, support  staff and site/side marked as required.  Patient sedated: No. Nerve block.    Patient tolerance: Patient tolerated the procedure well with no immediate complications. Joint: Left second toe.  Reduction technique: Manipulation    MDM   Meds given in ED:  Medications  lidocaine (XYLOCAINE) 2 % (with pres) injection 200 mg (not administered)    New Prescriptions   NAPROXEN (NAPROSYN) 500 MG TABLET    Take 1 tablet (500 mg total) by mouth 2 (two) times daily with a meal.    Final diagnoses:  Toe dislocation, left, initial encounter  Toe fracture, left, closed, initial encounter   This is a 64 y.o. female who presents to the ED complaining of left second toe pain after stubbing her second left toe earlier this morning.  Bilateral dorsalis pedis and posterior tibialis pulses are intact. Capillary refill is normal.  X-ray shows an acute displaced fracture through the midportion of the proximal second phalanx.   The distal fracture fragment is displaced dorsally and medially.  Digital nerve block performed by Dr. Maryan Rued.  Joint reduced by me. Patient tolerated well. Patient's foot is neurovascularly intact post reduction.  Patient placed in postop shoe and advised to follow with orthopedic surgery. Patient prescription for naproxen for pain control. I advised the patient to follow-up with their primary care provider this week. I advised the patient to return to the emergency department with new or worsening symptoms or new concerns. The patient verbalized understanding and agreement with plan.   This patient was discussed with and evaluated by Dr. Maryan Rued who agrees with assessment and plan.     Waynetta Pean, PA-C 01/06/15 6720  Blanchie Dessert, MD 01/07/15 [phone]

## 2015-01-06 NOTE — Discharge Instructions (Signed)
Toe Dislocation Toe dislocation is the displacement of the large bone of your toe (metatarsal) from the socket that connects it to your foot. Very strong, fibrous tissues (ligaments) connect your toe to a bone in your foot and stabilize the joint where these two bones meet. Dislocation is caused by a forceful impact to your toe. This impact moves your toe off the joint and often injures the ligaments that support it. SYMPTOMS Symptoms of toe dislocation include:  Noticeable deformity of your toe.  Pain, with loss of movement.  Looseness in your joint, indicating a tear of the ligaments (severe dislocations). DIAGNOSIS Toe dislocation is diagnosed through a physical exam. Often, X-ray exams are done to see if you have associated injuries, such as bone fractures. TREATMENT Toe dislocations are treated by putting your bones back into position (reduction) either by manipulation or through surgery. The toe is then kept in a fixed position (immobilized) in a cast, splint, or rigid postoperative shoe. In rare cases, surgical repair of a ligament is required, followed by immobilization of the toe.  HOME CARE INSTRUCTIONS The following measures can help to reduce pain and speed up the healing process:  Rest your injured joint. Do not move it. Avoid activities similar to the one that caused your injury.  Apply ice to your injured joint for 1 to 2 days after your reduction or as directed by your caregiver. Applying ice helps to reduce inflammation and pain.  Put ice in a plastic bag.  Place a towel between your skin and the bag.  Leave the ice on for 15 to 20 minutes at a time, every 2 hours while you are awake.  Elevate your foot above your heart as directed by your caregiver.  Take over-the-counter or prescription medicine for pain as directed by your caregiver.  If your caregiver has taped your toe to an adjacent toe, change the tape as directed by your caregiver. SEEK IMMEDIATE MEDICAL CARE  IF:  Your cast or splint becomes loose or damaged.  Your pain becomes worse, not better.  You lose feeling in your toe, or you cannot bend the tip of your toe. MAKE SURE YOU:  Understand these instructions.  Will watch your condition.  Will get help right away if you are not doing well or get worse. Document Released: 06/26/2001 Document Revised: 02/15/2014 Document Reviewed: 03/01/2011 [facility] Patient Information 2015 [place], [place]. This information is not intended to replace advice given to you by your health care provider. Make sure you discuss any questions you have with your health care provider. Toe Fracture Your caregiver has diagnosed you as having a fractured toe. A toe fracture is a break in the bone of a toe. "Buddy taping" is a way of splinting your broken toe, by taping the broken toe to the toe next to it. This "buddy taping" will keep the injured toe from moving beyond normal range of motion. Buddy taping also helps the toe heal in a more normal alignment. It may take 6 to 8 weeks for the toe injury to heal. [place] your toes taped together for as long as directed by your caregiver or until you see a doctor for a follow-up examination. You can change the tape after bathing. Always use a small piece of gauze or cotton between the toes when taping them together. This will help the skin stay dry and prevent infection.  Apply ice to the injury for 15-20 minutes each hour while awake for the  first 2 days. Put the ice in a plastic bag and place a towel between the bag of ice and your skin.  After the first 2 days, apply heat to the injured area. Use heat for the next 2 to 3 days. Place a heating pad on the foot or soak the foot in warm water as directed by your caregiver.  Keep your foot elevated as much as possible to lessen swelling.  Wear sturdy, supportive shoes. The shoes should not pinch the toes or fit tightly against the toes.  Your caregiver  may prescribe a rigid shoe if your foot is very swollen.  Your may be given crutches if the pain is too great and it hurts too much to walk.  Only take over-the-counter or prescription medicines for pain, discomfort, or fever as directed by your caregiver.  If your caregiver has given you a follow-up appointment, it is very important to keep that appointment. Not keeping the appointment could result in a chronic or permanent injury, pain, and disability. If there is any problem keeping the appointment, you must call back to this facility for assistance. SEEK MEDICAL CARE IF:   You have increased pain or swelling, not relieved with medications.  The pain does not get better after 1 week.  Your injured toe is cold when the others are warm. SEEK IMMEDIATE MEDICAL CARE IF:   The toe becomes cold, numb, or white.  The toe becomes hot (inflamed) and red. Document Released: 09/28/2000 Document Revised: 12/24/2011 Document Reviewed: 05/17/2008 [facility] Patient Information 2015 [place], [place]. This information is not intended to replace advice given to you by your health care provider. Make sure you discuss any questions you have with your health care provider.

## 2015-01-17 ENCOUNTER — Encounter: Payer: Self-pay | Admitting: Internal Medicine

## 2015-01-17 ENCOUNTER — Ambulatory Visit (INDEPENDENT_AMBULATORY_CARE_PROVIDER_SITE_OTHER): Payer: 59 | Admitting: Internal Medicine

## 2015-01-17 VITALS — BP 140/74 | HR 72 | Resp 16 | Ht 64.0 in | Wt 249.0 lb

## 2015-01-17 DIAGNOSIS — R609 Edema, unspecified: Secondary | ICD-10-CM

## 2015-01-17 DIAGNOSIS — Z1211 Encounter for screening for malignant neoplasm of colon: Secondary | ICD-10-CM

## 2015-01-17 DIAGNOSIS — E559 Vitamin D deficiency, unspecified: Secondary | ICD-10-CM

## 2015-01-17 DIAGNOSIS — Z0000 Encounter for general adult medical examination without abnormal findings: Secondary | ICD-10-CM | POA: Diagnosis not present

## 2015-01-17 LAB — POCT URINALYSIS DIPSTICK
Bilirubin, UA: NEGATIVE
Blood, UA: NEGATIVE
GLUCOSE UA: NEGATIVE
Ketones, UA: NEGATIVE
Leukocytes, UA: NEGATIVE
NITRITE UA: NEGATIVE
PROTEIN UA: NEGATIVE
SPEC GRAV UA: 1.01
Urobilinogen, UA: NEGATIVE
pH, UA: 6.5

## 2015-01-17 LAB — HEMOCCULT GUIAC POC 1CARD (OFFICE): FECAL OCCULT BLD: NEGATIVE

## 2015-01-17 NOTE — Progress Notes (Signed)
Subjective:    Patient ID: LENNY BOUCHILLON, female    DOB: September 26, 1951, 64 y.o.   MRN: [id number]  HPI  08/2013 note Assessment & Plan:  Health Maintenance Will give Shingles today. And she is to return in 8 weeks for her pneumovax. Pap today. Advised 3D mm   Bronchitis : Will give Z-pak or Tussionex.   Hyperlipidemia Recent labs good        TODAY  Jodette is here for CPE HM pt needs mm,  UTD with colonoscopy  Former smoker quit 30 years ago  Reports painful swelling of Right lower leg last 5-7 days .  She has had lazer venous surgery in the past   (Dr. Aleda Grana)    NO redness Some numbness of Right lateral thigh only  Recent R vitrectomy  Dr.  Baird Cancer  Doing well  Allergies  Allergen Reactions  . Mold Extract [Trichophyton Mentagrophyte]    Past Medical History  Diagnosis Date  . Migraine   . Urge incontinence   . Anxiety   . Arthritis   . Colon polyps   . Vaginal dryness   . Bacterial infection   . Yeast infection   . Pregnancy, ectopic, tubal   . Hx of varicella   . Hx of measles   . Macular degeneration    Past Surgical History  Procedure Laterality Date  . Cholecystectomy  1976  . Tubal ligation    . Vein surgery     History   Social History  . Marital Status: Married    Spouse Name: N/A  . Number of Children: N/A  . Years of Education: N/A   Occupational History  . Not on file.   Social History Main Topics  . Smoking status: Former Smoker    Quit date: 10/16/1975  . Smokeless tobacco: Never Used  . Alcohol Use: Yes     Comment: socially  . Drug Use: No  . Sexual Activity: Yes    Birth Control/ Protection: Post-menopausal   Other Topics Concern  . Not on file   Social History Narrative   Family History  Problem Relation Age of Onset  . Arthritis Mother   . Diabetes Mother   . Heart disease Brother   . Kidney disease Brother   . Colon cancer Brother 45  . Drug abuse Brother   . Heart disease Sister   . Diabetes  Sister   . Hyperlipidemia Sister    Patient Active Problem List   Diagnosis Date Noted  . Edema 08/11/2013  . Varicose veins 08/04/2013  . Tinea 11/13/2012  . Urinary urgency 11/13/2012  . UTI (lower urinary tract infection) 08/21/2012  . Vertigo 07/23/2012  . Headache 07/23/2012  . Hyperlipidemia 04/02/2012  . Vaginal spotting 04/02/2012  . Obesity 04/02/2012  . Lethargy 01/28/2012  . Vulvitis 01/28/2012  . Anxiety   . Arthritis   . Urge incontinence   . Colon polyps    Current Outpatient Prescriptions on File Prior to Visit  Medication Sig Dispense Refill  . azithromycin (ZITHROMAX) 250 MG tablet Take as directed 6 tablet 0  . b complex vitamins tablet Take 1 tablet by mouth daily.    . Calcium Carbonate-Vitamin D (CALCIUM + D PO) Take 1 tablet by mouth.    . cholecalciferol (VITAMIN D) 1000 UNITS tablet Take 1,000 Units by mouth daily.    Marland Kitchen estradiol (ESTRACE) 0.1 MG/GM vaginal cream Place 1 Applicatorful vaginally at bedtime.    Marland Kitchen ibuprofen (ADVIL,MOTRIN) 800 MG tablet  Take one bid for 7 days 30 tablet 0  . Misc Natural Products (LUTEIN VISION BLEND PO) Take 1 tablet by mouth.    . naproxen (NAPROSYN) 500 MG tablet Take 1 tablet (500 mg total) by mouth 2 (two) times daily with a meal. 30 tablet 0   No current facility-administered medications on file prior to visit.       Review of Systems  Respiratory: Negative for cough, chest tightness, shortness of breath and wheezing.   Cardiovascular: Negative for chest pain, palpitations and leg swelling.  All other systems reviewed and are negative.      Objective:   Physical Exam Physical Exam  Nursing note and vitals reviewed.  Constitutional: She is oriented to person, place, and time. She appears well-developed and well-nourished.  HENT:  Head: Normocephalic and atraumatic.  Right Ear: Tympanic membrane and ear canal normal. No drainage. Tympanic membrane is not injected and not erythematous.  Left Ear: Tympanic  membrane and ear canal normal. No drainage. Tympanic membrane is not injected and not erythematous.  Nose: Nose normal. Right sinus exhibits no maxillary sinus tenderness and no frontal sinus tenderness. Left sinus exhibits no maxillary sinus tenderness and no frontal sinus tenderness.  Mouth/Throat: Oropharynx is clear and moist. No oral lesions. No oropharyngeal exudate.  Eyes: Conjunctivae and EOM are normal. Pupils are equal, round, and reactive to light.  Neck: Normal range of motion. Neck supple. No JVD present. Carotid bruit is not present. No mass and no thyromegaly present.  Cardiovascular: Normal rate, regular rhythm, S1 normal, S2 normal and intact distal pulses. Exam reveals no gallop and no friction rub.  No murmur heard.  Pulses:  Carotid pulses are 2+ on the right side, and 2+ on the left side.  Dorsalis pedis pulses are 2+ on the right side, and 2+ on the left side.  No carotid bruit. No LE edema  Pulmonary/Chest: Breath sounds normal. She has no wheezes. She has no rales. She exhibits no tenderness.  Breast no discrete mass no nipple discharge no axillary adenopathy bilaterally Abdominal: Soft. Bowel sounds are normal. She exhibits no distension and no mass. There is no hepatosplenomegaly. There is no tenderness. There is no CVA tenderness.  Musculoskeletal: Normal range of motion.  No active synovitis to joints.  Lymphadenopathy:  She has no cervical adenopathy.  She has no axillary adenopathy.  Right: No inguinal and no supraclavicular adenopathy present.  Left: No inguinal and no supraclavicular adenopathy present.  Neurological: She is alert and oriented to person, place, and time. She has normal strength and normal reflexes. She displays no tremor. No cranial nerve deficit or sensory deficit. Coordination and gait normal.  Skin: Skin is warm and dry. No rash noted. No cyanosis. Nails show no clubbing.  Psychiatric: She has a normal mood and affect. Her speech is normal and  behavior is normal. Cognition and memory are normal.           Assessment & Plan:  HM Pt has mm schedule later this month  UTD with colonoscopy  Quit smoking 30 years ago  Counseled lung CA screening guidelines.  Declines vaccines until checking with insurance  RLE edema   She is S/P lazer venous surgery but will check venous doppler.   Pt counseled several times that she needs this exam today to rule out a DVT   She tells me she has to pick up her husband and is not sure she can have test today.  I counseled her risks  of undiagnosed DVT including blood clots to lung  R thigh paresthesia  Possible meralgia paresthetica   Will need to rule out DVT first   Hyperlipidemia check today   Colon polyps   Will need to get GYN notes as pt was counseled to see [facility] after vaginal spotting .  I have never received notes.

## 2015-01-25 ENCOUNTER — Encounter: Payer: Self-pay | Admitting: *Deleted

## 2015-07-21 ENCOUNTER — Encounter: Payer: Self-pay | Admitting: Physical Therapy

## 2015-07-21 ENCOUNTER — Ambulatory Visit: Payer: 59 | Attending: Orthopaedic Surgery | Admitting: Physical Therapy

## 2015-07-21 DIAGNOSIS — R29898 Other symptoms and signs involving the musculoskeletal system: Secondary | ICD-10-CM | POA: Diagnosis present

## 2015-07-21 DIAGNOSIS — M542 Cervicalgia: Secondary | ICD-10-CM | POA: Insufficient documentation

## 2015-07-21 NOTE — Patient Instructions (Signed)
Cervical Stabilization       Nod head, tipping chin down. Tighten muscles in back of throat. Hold _5__ seconds. Do 5-10___ times, _3__ times per day.  http://ss.exer.us/192   Copyright  VHI. All rights reserved.

## 2015-07-21 NOTE — Therapy (Signed)
[facility] [address] [place], [place], [address] Phone: [phone]   Fax:  [phone]  Physical Therapy Evaluation  Patient Details  Name: Judith Gamble MRN: [id number] Date of Birth: 1950/12/04 Referring Provider:  Marybelle Killings, MD  Encounter Date: 07/21/2015      PT End of Session - 07/21/15 1134    Visit Number 1   Number of Visits 4   Date for PT Re-Evaluation 08/12/15   PT Start Time 1055   PT Stop Time 1134   PT Time Calculation (min) 39 min   Activity Tolerance Patient tolerated treatment well;Patient limited by pain   Behavior During Therapy [facility] for tasks assessed/performed      Past Medical History  Diagnosis Date  . Migraine   . Urge incontinence   . Anxiety   . Arthritis   . Colon polyps   . Vaginal dryness   . Bacterial infection   . Yeast infection   . Pregnancy, ectopic, tubal   . Hx of varicella   . Hx of measles   . Macular degeneration     Past Surgical History  Procedure Laterality Date  . Cholecystectomy  1976  . Tubal ligation    . Vein surgery      There were no vitals filed for this visit.  Visit Diagnosis:  Weakness of both upper extremities - Plan: PT plan of care cert/re-cert  Painful cervical range of motion - Plan: PT plan of care cert/re-cert      Subjective Assessment - 07/21/15 1110    Subjective Rt UE began going numb and tingling, pt saw hand specialist who referred her to neurology. pt has had this off and on for over 30 years, now it is worsening.  Neurology saw arthritis and spondylisis in cervical spine.  Pt has had multiple therapies and massage to address neck and UE symptoms. Pt is having surgery but MD wanted to try PT to increase use of UE before surgery            [facility] PT Assessment - 07/21/15 0001    Assessment   Medical Diagnosis c spine spondylisis, neck pain radiating to both hands   Precautions   Precautions None   Restrictions   Weight Bearing Restrictions No   Balance Screen   Has the patient fallen in the past 6 months No   [place] residence   Living Arrangements Spouse/significant other   Prior Function   Level of Independence Independent;Independent with basic ADLs   Vocation Full time employment   Vocation Requirements typing   Cognition   Overall Cognitive Status Within Functional Limits for tasks assessed   Observation/Other Assessments   Focus on Therapeutic Outcomes (FOTO)  62% limited   Sensation   Light Touch --  occasional numbness/tingling in UEs   Coordination   Fine Motor Movements are Fluid and Coordinated No  opposition B hands increased time   AROM   Overall AROM Comments --  shoulder ROM WFL   AROM Assessment Site Cervical   Cervical Flexion WFL   Cervical Extension     Cervical - Right Side Bend 25   Cervical - Left Side Bend 20   Cervical - Right Rotation 45   Cervical - Left Rotation 40   Strength   Strength Assessment Site Hand   Right/Left hand Right;Left   Right Hand Grip (lbs) 24   Left Hand Grip (lbs) 38  Palpation   Palpation comment pt very tender to palpation and gentle mobility of c spine 3-7 and first rib, suboccipitals                   [facility] Adult PT Treatment/Exercise - 07/21/15 0001    Exercises   Exercises Neck   Neck Exercises: Standing   Neck Retraction 10 reps;3 secs   Neck Exercises: Seated   Other Seated Exercise scapula retraction x 10   Other Seated Exercise shoulder rolls x 10                PT Education - 07/21/15 1131    Education provided Yes   Education Details HEP, PT POC   Person(s) Educated Patient   Methods Explanation;Demonstration;Handout   Comprehension Verbalized understanding;Returned demonstration          PT Short Term Goals - 07/21/15 1137    PT SHORT TERM GOAL #1   Title Pt will be independent in initial HEP   Time 2   Period Weeks   Status  New           PT Long Term Goals - 07/21/15 1137    PT LONG TERM GOAL #1   Title Pt will demo improved sitting posture   Time 4   Period Weeks   Status New   PT LONG TERM GOAL #2   Title Pt will tolerate typing >5 minutes without increase in symptoms   Time 4   Period Weeks   PT LONG TERM GOAL #3   Title Pt will be independent in advanced HEP   Time 4   Period Weeks   Status New   PT LONG TERM GOAL #4   Title Pt will improve FOTO to <=50 to demo improved mobility   Time 4   Period Weeks   PT LONG TERM GOAL #5   Title Pt will demo an increase of 5 lbs in bilateral grip strnegth   Time 4   Period Weeks               Plan - 07/21/15 1135    Clinical Impression Statement Pt presents with decreased cervical ROM, decreased strength B UEs.  Pt with difficulty tolerating palpation or gentle jt mobilization to cervical spine, able to tolerate gentle posture exercises. Pt to have surgery on c spine after PT is finished. Pt will benefit from skilled PT to increase mobility and strength and improve posture before surgery   Pt will benefit from skilled therapeutic intervention in order to improve on the following deficits Decreased activity tolerance;Decreased endurance;Pain;Decreased range of motion;Decreased strength;Increased muscle spasms;Impaired sensation;Impaired UE functional use   Rehab Potential Good   PT Frequency 1x / week   PT Duration 4 weeks   PT Treatment/Interventions ADLs/Self Care Home Management;Cryotherapy;Electrical Stimulation;Moist Heat;Taping;Patient/family education;Neuromuscular re-education;Passive range of motion;Therapeutic exercise;Therapeutic activities;Manual techniques   PT Next Visit Plan assess HEP, attempt stretching if pt able to toelrate   PT Home Exercise Plan scap retraction, neck retraction, shoulder rolls   Consulted and Agree with Plan of Care Patient         Problem List Patient Active Problem List   Diagnosis Date Noted  .  Edema 08/11/2013  . Varicose veins 08/04/2013  . Tinea 11/13/2012  . Urinary urgency 11/13/2012  . UTI (lower urinary tract infection) 08/21/2012  . Vertigo 07/23/2012  . Headache(784.0) 07/23/2012  . Hyperlipidemia 04/02/2012  . Vaginal spotting 04/02/2012  . Obesity 04/02/2012  . Lethargy 01/28/2012  .  Vulvitis 01/28/2012  . Anxiety   . Arthritis   . Urge incontinence   . Colon polyps     Isabelle Course, PT, DPT  07/21/2015, 11:42 AM  [facility] [address], [place], [place], [address] Phone: [phone]   Fax:  [phone]

## 2015-08-03 ENCOUNTER — Encounter: Payer: Self-pay | Admitting: Physical Therapy

## 2015-08-03 ENCOUNTER — Ambulatory Visit: Payer: 59 | Admitting: Physical Therapy

## 2015-08-03 DIAGNOSIS — R29898 Other symptoms and signs involving the musculoskeletal system: Secondary | ICD-10-CM

## 2015-08-03 DIAGNOSIS — M542 Cervicalgia: Secondary | ICD-10-CM

## 2015-08-03 NOTE — Therapy (Signed)
[facility] [address] [place], [place], [address] Phone: [phone]   Fax:  [phone]  Physical Therapy Treatment  Patient Details  Name: Judith Gamble MRN: [id number] Date of Birth: 11/21/1950 No Data Recorded  Encounter Date: 08/03/2015      PT End of Session - 08/03/15 1602    Visit Number 2   Number of Visits 4   Date for PT Re-Evaluation 08/12/15   PT Start Time 7782   PT Stop Time 1615   PT Time Calculation (min) 45 min   Activity Tolerance Patient tolerated treatment well;Patient limited by pain   Behavior During Therapy [facility] for tasks assessed/performed      Past Medical History  Diagnosis Date  . Migraine   . Urge incontinence   . Anxiety   . Arthritis   . Colon polyps   . Vaginal dryness   . Bacterial infection   . Yeast infection   . Pregnancy, ectopic, tubal   . Hx of varicella   . Hx of measles   . Macular degeneration     Past Surgical History  Procedure Laterality Date  . Cholecystectomy  1976  . Tubal ligation    . Vein surgery      There were no vitals filed for this visit.  Visit Diagnosis:  Weakness of both upper extremities  Painful cervical range of motion      Subjective Assessment - 08/03/15 1543    Subjective Pt complains of swelling in Rt > Lt, radiating pain mailly in Rt UE. Pt has this since over 30 years off and on for over 63years, now it is worsening. Pt will have a MRI  and then have surgery. PT is scheduled to help to improve ROM    Currently in Pain? Yes   Pain Score 8    Pain Location Neck   Pain Orientation Right;Left;Medial   Pain Onset Other (comment)  35 years ago pt felt fx tailbone, compressed vertebrae in neck & thoracic   Pain Frequency Constant   Multiple Pain Sites Yes   Pain Location Arm   Pain Orientation Right   Pain Descriptors / Indicators Burning;Tingling;Numbness   Pain Type Chronic pain   Pain Radiating Towards right UE  and some in left UE, fingers lock up -unable to flex fingers   Pain Onset Other (comment)  35 years ago, worsening lately                         [facility] Adult PT Treatment/Exercise - 08/03/15 0001    Exercises   Exercises Neck   Neck Exercises: Seated   Neck Retraction 15 reps;3 secs   Cervical Rotation Both;5 reps  20 sec hold   Other Seated Exercise scapula retraction x 10   Other Seated Exercise shoulder rolls x 10   Neck Exercises: Supine   Other Supine Exercise Foam roll for elongation and decompression x 4 min -arm supported on 2 pillow each, head with 2 towels   Other Supine Exercise B UE into 90degrees of flexion 2 x 10 while on FR   Modalities   Modalities Moist Heat   Moist Heat Therapy   Number Minutes Moist Heat 15 Minutes   Moist Heat Location Cervical   Manual Therapy   Manual Therapy Soft tissue mobilization   Manual therapy comments tight SCM, bil cervical paraspinals, [place], temporalis Rt > Lt   Soft tissue mobilization pt tolerated STW  with gentle PROM well performed in supine                  PT Short Term Goals - 08/03/15 1624    PT SHORT TERM GOAL #1   Title Pt will be independent in initial HEP   Time 2   Period Weeks   Status On-going           PT Long Term Goals - 08/03/15 1625    PT LONG TERM GOAL #1   Title Pt will demo improved sitting posture   Time 4   Period Weeks   Status On-going   PT LONG TERM GOAL #2   Title Pt will tolerate typing >5 minutes without increase in symptoms   Time 4   Period Weeks   Status On-going   PT LONG TERM GOAL #3   Title Pt will be independent in advanced HEP   Time 4   Period Weeks   Status On-going   PT LONG TERM GOAL #4   Title Pt will improve FOTO to <=50 to demo improved mobility   Time 4   Period Weeks   Status On-going   PT LONG TERM GOAL #5   Title Pt will demo an increase of 5 lbs in bilateral grip strnegth   Time 4   Period Weeks   Status On-going                Plan - 08/03/15 1603    Clinical Impression Statement Pt presents with decreased cervical ROM, decreased strength B UE's. Pt limited with strengthening exercises due to incresed discomfort and radiating pain in Rt UE. Pt will benefit from skilled PT to increase mobility and strength to improve posture before surgery.    Pt will benefit from skilled therapeutic intervention in order to improve on the following deficits Decreased activity tolerance;Decreased endurance;Pain;Decreased range of motion;Decreased strength;Increased muscle spasms;Impaired sensation;Impaired UE functional use   Rehab Potential Good   PT Frequency 1x / week   PT Duration 4 weeks   PT Next Visit Plan Needs renewal next scheduled visit 08/10/15.Continue with stretching and gentle strengthening, softtissue work to neck.   Consulted and Agree with Plan of Care Patient        Problem List Patient Active Problem List   Diagnosis Date Noted  . Edema 08/11/2013  . Varicose veins 08/04/2013  . Tinea 11/13/2012  . Urinary urgency 11/13/2012  . UTI (lower urinary tract infection) 08/21/2012  . Vertigo 07/23/2012  . Headache(784.0) 07/23/2012  . Hyperlipidemia 04/02/2012  . Vaginal spotting 04/02/2012  . Obesity 04/02/2012  . Lethargy 01/28/2012  . Vulvitis 01/28/2012  . Anxiety   . Arthritis   . Urge incontinence   . Colon polyps     NAUMANN-HOUEGNIFIO,Jareli Highland PTA 08/03/2015, 5:20 PM  [facility] [address], [place], [place], [address] Phone: [phone]   Fax:  [phone]  Name: Judith Gamble MRN: [id number] Date of Birth: 1951/04/21

## 2015-08-10 ENCOUNTER — Ambulatory Visit: Payer: 59 | Admitting: Physical Therapy

## 2015-08-10 ENCOUNTER — Encounter: Payer: Self-pay | Admitting: Physical Therapy

## 2015-08-10 DIAGNOSIS — M542 Cervicalgia: Secondary | ICD-10-CM

## 2015-08-10 DIAGNOSIS — R29898 Other symptoms and signs involving the musculoskeletal system: Secondary | ICD-10-CM | POA: Diagnosis not present

## 2015-08-10 NOTE — Therapy (Addendum)
[facility] [address] [place], [place], [address] Phone: [phone]   Fax:  [phone]  Physical Therapy Treatment  Patient Details  Name: Judith Gamble MRN: [id number] Date of Birth: [birth year] Referring Provider: Lorin Mercy  Encounter Date: 08/10/2015      PT End of Session - 08/10/15 1609    Visit Number 3   Number of Visits 4   Date for PT Re-Evaluation 09/02/15   PT Start Time 7048   PT Stop Time 1620   PT Time Calculation (min) 50 min   Activity Tolerance Patient tolerated treatment well;Patient limited by pain   Behavior During Therapy [facility] for tasks assessed/performed      Past Medical History  Diagnosis Date  . Migraine   . Urge incontinence   . Anxiety   . Arthritis   . Colon polyps   . Vaginal dryness   . Bacterial infection   . Yeast infection   . Pregnancy, ectopic, tubal   . Hx of varicella   . Hx of measles   . Macular degeneration     Past Surgical History  Procedure Laterality Date  . Cholecystectomy  1976  . Tubal ligation    . Vein surgery      There were no vitals filed for this visit.  Visit Diagnosis:  Weakness of both upper extremities - Plan: PT plan of care cert/re-cert  Painful cervical range of motion - Plan: PT plan of care cert/re-cert      Subjective Assessment - 08/10/15 1533    Subjective Pt reports pain iin average rated as 3/10  but with reaching activities pain can reach up to 7/10 in Rt shoulder and Rt UE. Pt has this since over 30years off and on, now it is worsening and MRI will be scheduled and then may have surgery..    Pertinent History Pt has neck pain since 30years, initially started due to a fall when 16month[age]regnant  -dog pushed her   Limitations House hold activities   Patient Stated Goals improve use of both hands   Pain Score 3    Pain Location Neck   Pain Orientation Right;Left;Medial   Pain Descriptors / Indicators  Aching;Sharp;Shooting   Pain Type Chronic pain            [facility] PT Assessment - 08/10/15 0001    Assessment   Medical Diagnosis c spine spondylisis, neck pain radiating to both hands   Referring Provider Yates   Precautions   Precautions None   Restrictions   Weight Bearing Restrictions No   Balance Screen   Has the patient fallen in the past 6 months No   [place]   Living Arrangements Spouse/significant other   Prior Function   Level of Independence Independent;Independent with basic ADLs   Vocation Full time employment   Vocation Requirements typing   Cognition   Overall Cognitive Status Within Functional Limits for tasks assessed   Observation/Other Assessments   Focus on Therapeutic Outcomes (FOTO)  62% limited  as  of 07/21/15   Sensation   Light Touch --  tingling and numbness in both hands   Coordination   Fine Motor Movements are Fluid and Coordinated No   AROM   AROM Assessment Site Cervical   Cervical Flexion WFL   Cervical - Right Side Bend 25   Cervical - Left Side Bend 20   Cervical - Right Rotation 45   Cervical -  Left Rotation 40   Strength   Strength Assessment Site Hand   Right/Left hand Right;Left   Right Hand Grip (lbs) 40   Left Hand Grip (lbs) 55   Palpation   Palpation comment pt with palpaple tenderness in bilt UT and bil cervical paraspinals                     [facility] Adult PT Treatment/Exercise - 08/10/15 0001    Exercises   Exercises Neck   Neck Exercises: Seated   Neck Retraction 15 reps;3 secs   Cervical Rotation Both;5 reps   Other Seated Exercise scapula retraction x 10   Other Seated Exercise shoulder rolls x 10   Neck Exercises: Supine   Other Supine Exercise Foam roll for elongation and decompression x 4 min -arm supported on 2 pillow each, head with 2 towels   Modalities   Modalities Moist Heat   Moist Heat Therapy   Number Minutes Moist Heat 15 Minutes   Moist  Heat Location Cervical   Manual Therapy   Manual Therapy Soft tissue mobilization   Manual therapy comments tight SCM, bil cervical paraspinals, [place], temporalis Rt > Lt   Soft tissue mobilization pt tolerated STW with gentle PROM well performed in supine                  PT Short Term Goals - 08/03/15 1624    PT SHORT TERM GOAL #1   Title Pt will be independent in initial HEP   Time 2   Period Weeks   Status On-going           PT Long Term Goals - 08/10/15 1611    PT LONG TERM GOAL #1   Title Pt will demo improved sitting posture   Time 4   Period Weeks   Status On-going   PT LONG TERM GOAL #2   Title Pt will tolerate typing >5 minutes without increase in symptoms   Time 4   Period Weeks   Status On-going   PT LONG TERM GOAL #3   Title Pt will be independent in advanced HEP   Time 4   Period Weeks   Status On-going   PT LONG TERM GOAL #4   Title Pt will improve FOTO to <=50 to demo improved mobility   Time 4   Period Weeks   Status On-going   PT LONG TERM GOAL #5   Title Pt will demo an increase of 5 lbs in bilateral grip strnegth   Time 4   Period Weeks   Status Achieved               Plan - 08/10/15 1609    Clinical Impression Statement Pt with decreased cervical ROM, decreased strength both UE's. Pt has 1 more PT session and now is waiting for appointment for MRI. Pt will continue to benefit from skilled PT to improve ROM and strength    Pt will benefit from skilled therapeutic intervention in order to improve on the following deficits Decreased activity tolerance;Decreased endurance;Pain;Decreased range of motion;Decreased strength;Increased muscle spasms;Impaired sensation;Impaired UE functional use   Rehab Potential Good   PT Frequency 1x / week   PT Duration 3 weeks   PT Treatment/Interventions ADLs/Self Care Home Management;Cryotherapy;Electrical Stimulation;Moist Heat;Taping;Patient/family education;Neuromuscular  re-education;Passive range of motion;Therapeutic exercise;Therapeutic activities;Manual techniques   PT Next Visit Plan One more session and then D/C   Consulted and Agree with Plan of Care Patient  Problem List Patient Active Problem List   Diagnosis Date Noted  . Edema 08/11/2013  . Varicose veins 08/04/2013  . Tinea 11/13/2012  . Urinary urgency 11/13/2012  . UTI (lower urinary tract infection) 08/21/2012  . Vertigo 07/23/2012  . Headache(784.0) 07/23/2012  . Hyperlipidemia 04/02/2012  . Vaginal spotting 04/02/2012  . Obesity 04/02/2012  . Lethargy 01/28/2012  . Vulvitis 01/28/2012  . Anxiety   . Arthritis   . Urge incontinence   . Colon polyps    Shatoria Stooksbury Naumann-Houegnifio, PTA 08/10/2015 5:34 PM PHYSICAL THERAPY DISCHARGE SUMMARY  Visits from Start of Care: 3  Current functional level related to goals / functional outcomes: Pt attended 3 PT sessions and then didn't return for future PT.   Remaining deficits: See above for most current status   Education / Equipment: HEP, posture Plan: Patient agrees to discharge.  Patient goals were partially met. Patient is being discharged due to not returning since the last visit.  ?????   Sigurd Sos, PT 10/05/2015 11:10 AM  [facility] [address], [place], [place], [address] Phone: [phone]   Fax:  [phone]  Name: ALISAN DOKES MRN: [id number] Date of Birth: 04-23-1951

## 2015-08-16 ENCOUNTER — Other Ambulatory Visit: Payer: Self-pay | Admitting: Orthopaedic Surgery

## 2015-08-16 DIAGNOSIS — M542 Cervicalgia: Secondary | ICD-10-CM

## 2015-08-17 ENCOUNTER — Encounter: Payer: 59 | Admitting: Physical Therapy

## 2015-08-21 ENCOUNTER — Ambulatory Visit
Admission: RE | Admit: 2015-08-21 | Discharge: 2015-08-21 | Disposition: A | Discharge status: Home / Self Care | Payer: 59 | Source: Ambulatory Visit | Attending: Orthopaedic Surgery | Admitting: Orthopaedic Surgery

## 2015-08-21 DIAGNOSIS — M542 Cervicalgia: Secondary | ICD-10-CM

## 2016-06-14 IMAGING — CR DG FOOT COMPLETE 3+V*L*
3 series · 3 of 3 positions shown · non-contrast
Comparison: None.

CLINICAL DATA: 64-year-old female with left second toe pain after
jamming toe into shelving

EXAM:
LEFT FOOT - COMPLETE 3+ VIEW

[x foot ap left]
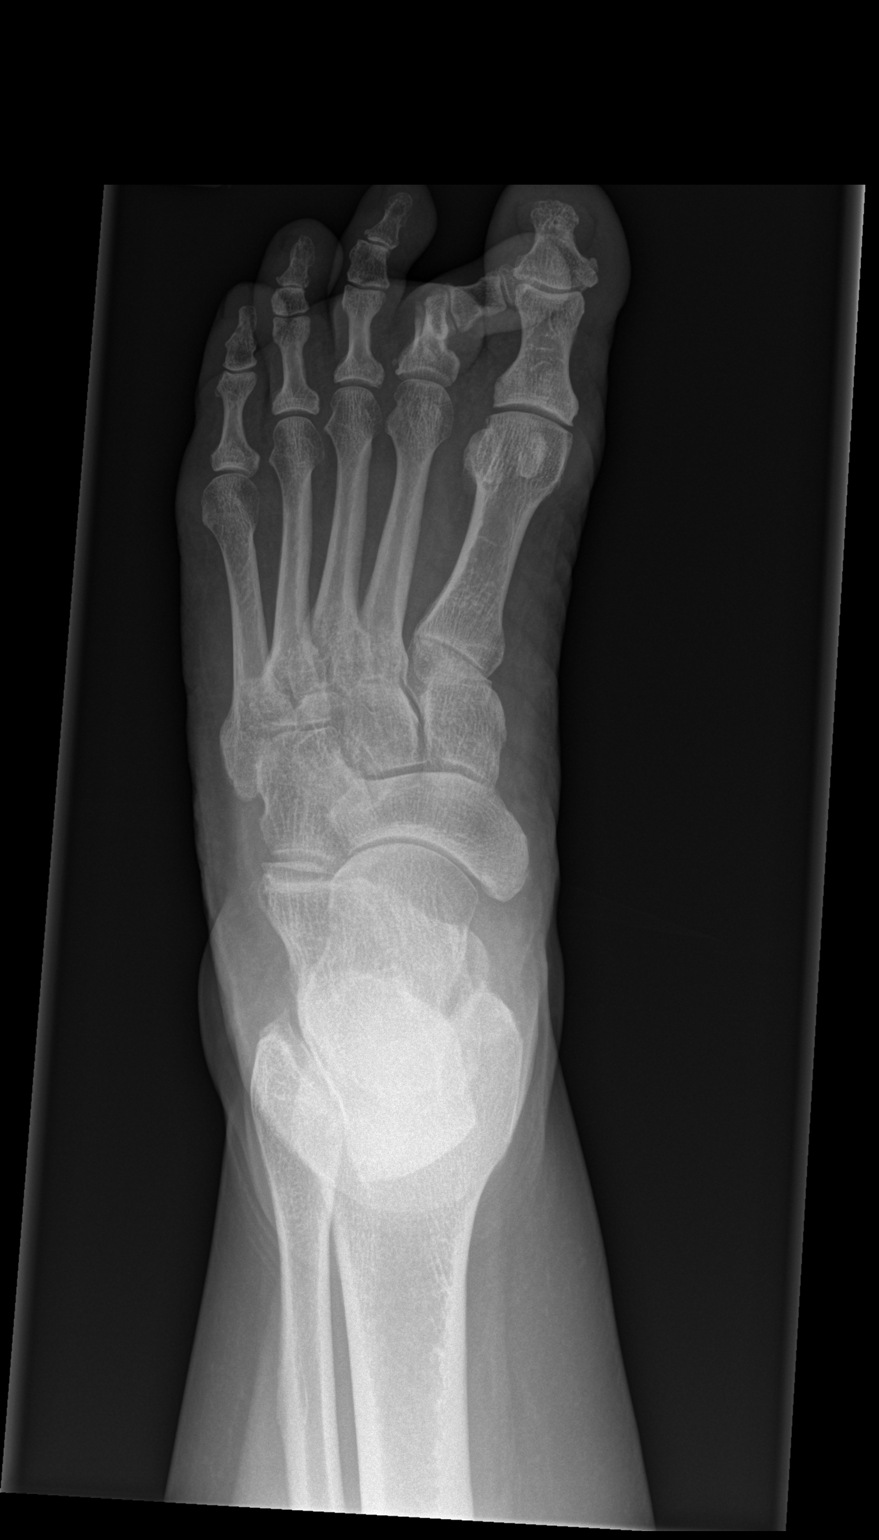

[x foot obl left]
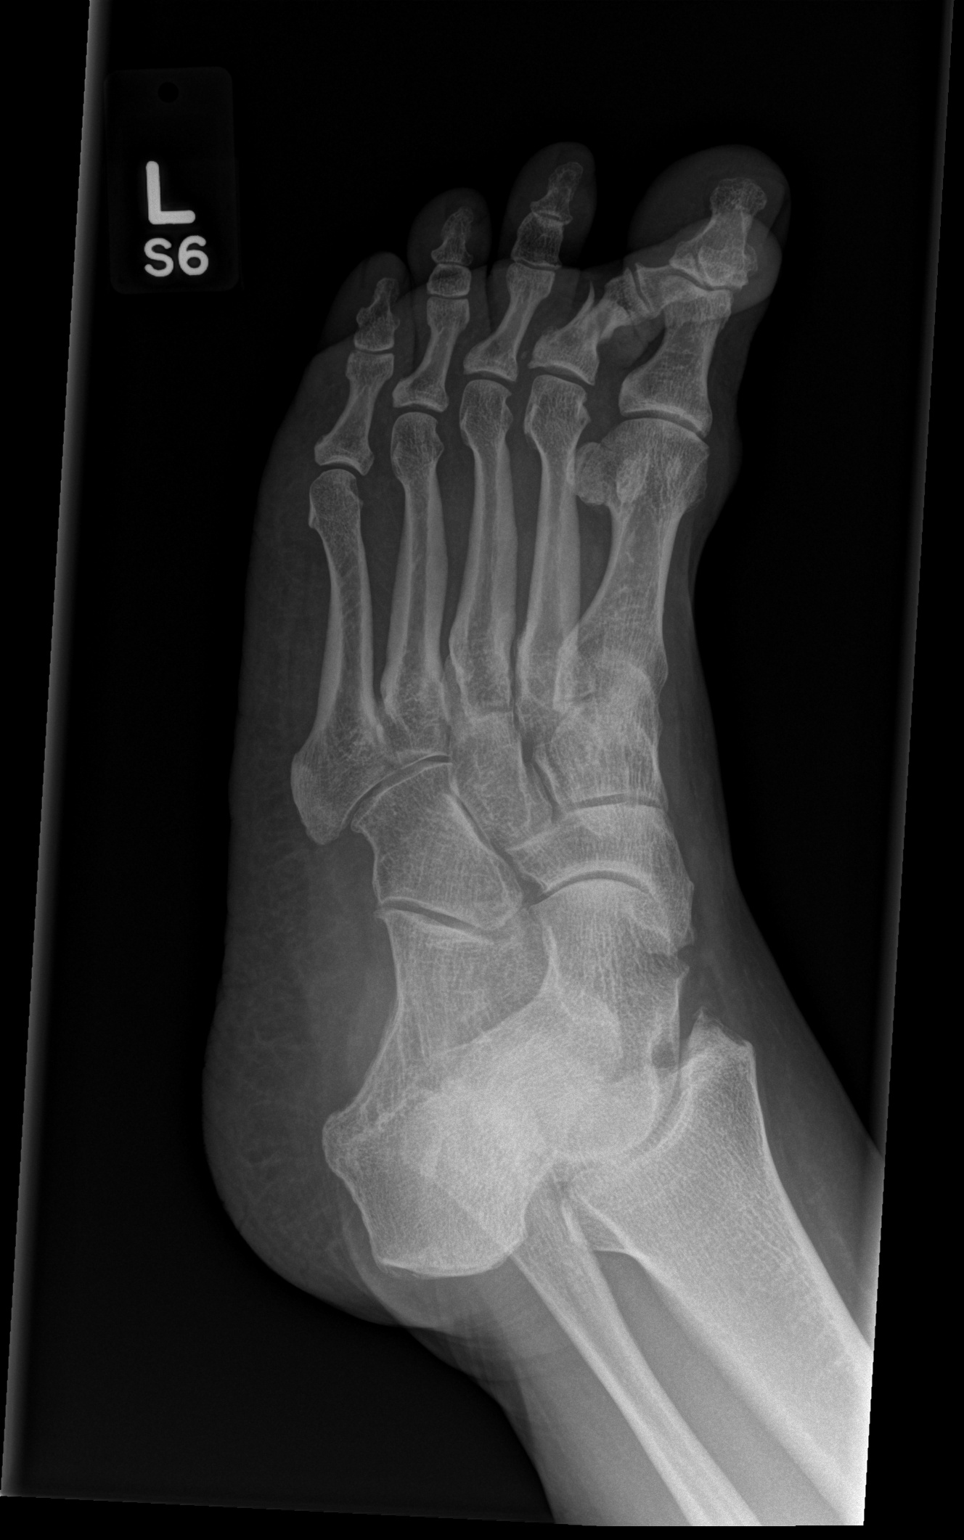

[x foot lat left]
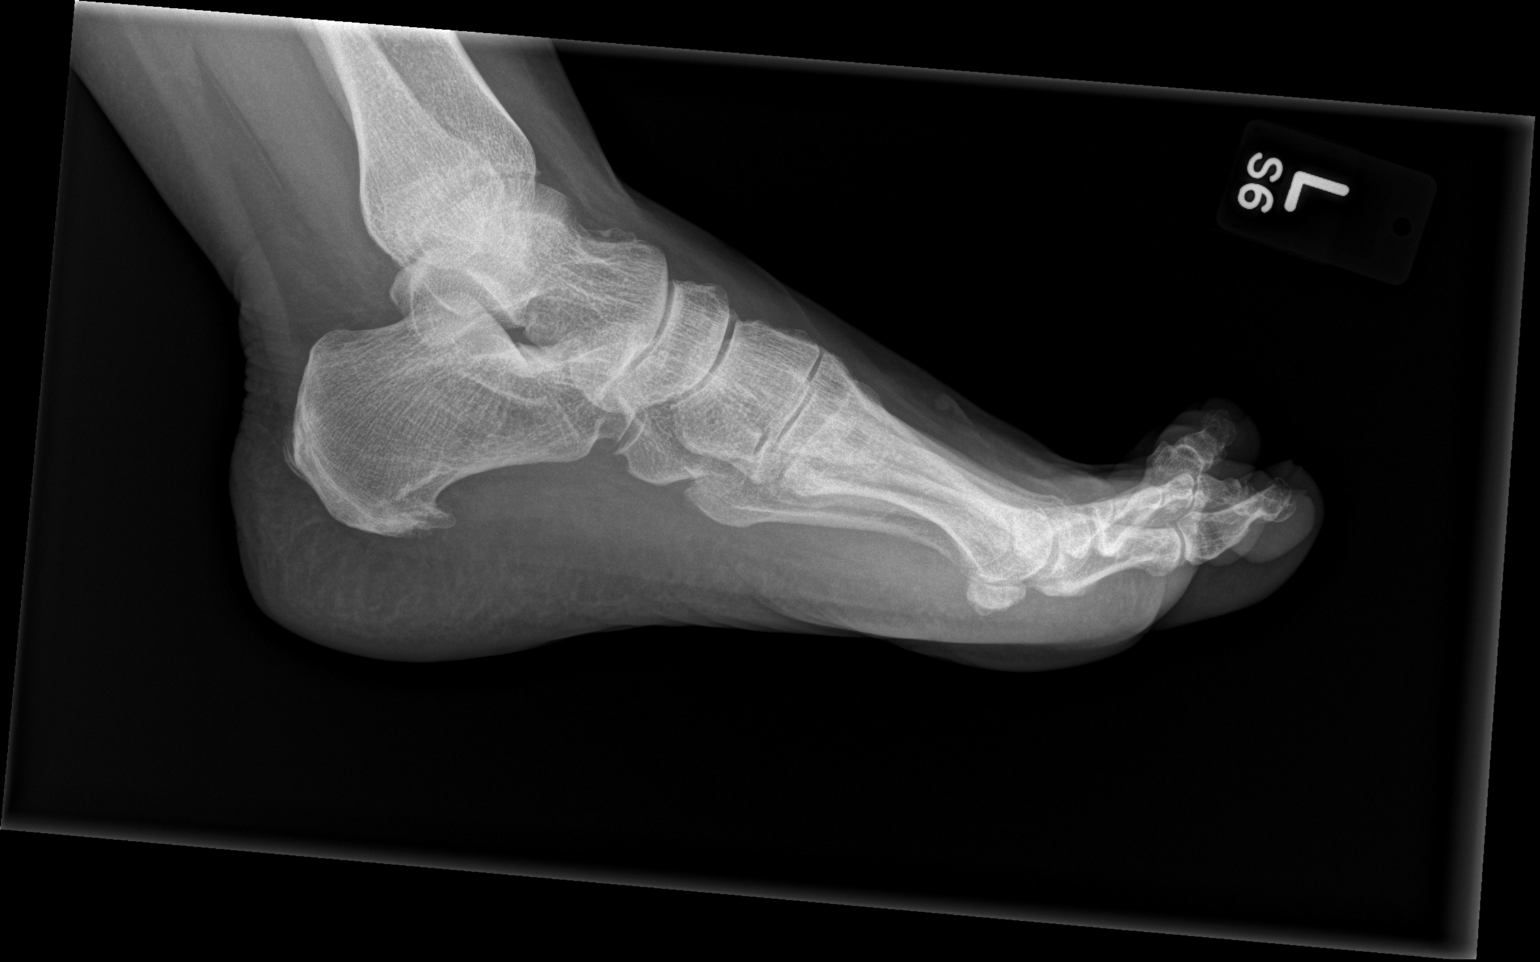

[3 of 3 positions shown; findings below may reference images not displayed]

FINDINGS: Acute displaced fracture through the midportion of the proximal
phalanx of the second toe. The distal fracture fragment is displaced
dorsally and medially. The joints remain congruent. Normal bony
mineralization for age. No lytic or blastic osseous lesion. No
significant degenerative change. Plantar calcaneal spurring noted
incidentally.
IMPRESSION: Acute displaced fracture through the midportion of the proximal
second phalanx. The distal fracture fragment is displaced dorsally
and medially.

## 2018-02-04 ENCOUNTER — Telehealth: Payer: Self-pay

## 2018-02-04 NOTE — Telephone Encounter (Signed)
Please advise 

## 2018-02-04 NOTE — Telephone Encounter (Signed)
No, I am full

## 2018-02-04 NOTE — Telephone Encounter (Signed)
Copied from [place]. Topic: Appointment Scheduling - New Patient >> Feb 04, 2018  2:51 PM Hewitt Shorts wrote: Pt is looking for a new PCP due to change in insurance which is [place] [facility] and is wanting to see if Dr. Derrel Nip will take her on as a patient   Best number [phone]

## 2018-02-05 NOTE — Telephone Encounter (Signed)
Spoke with pt and informed her that Dr. Derrel Nip is not accepting new patients at this time. Pt gave a verbal understanding.

## 2018-03-27 ENCOUNTER — Ambulatory Visit: Payer: 59 | Admitting: Internal Medicine

## 2018-05-02 ENCOUNTER — Other Ambulatory Visit: Payer: Self-pay | Admitting: Family Medicine

## 2018-05-02 DIAGNOSIS — R109 Unspecified abdominal pain: Secondary | ICD-10-CM

## 2018-05-14 ENCOUNTER — Ambulatory Visit
Admission: RE | Admit: 2018-05-14 | Discharge: 2018-05-14 | Disposition: A | Discharge status: Home / Self Care | Payer: 59 | Source: Ambulatory Visit | Attending: Family Medicine | Admitting: Family Medicine

## 2018-05-14 DIAGNOSIS — R109 Unspecified abdominal pain: Secondary | ICD-10-CM

## 2019-09-07 ENCOUNTER — Other Ambulatory Visit: Payer: Self-pay | Admitting: Family Medicine

## 2019-09-07 DIAGNOSIS — Z1231 Encounter for screening mammogram for malignant neoplasm of breast: Secondary | ICD-10-CM

## 2020-02-12 ENCOUNTER — Other Ambulatory Visit: Payer: Self-pay | Admitting: Surgery

## 2020-02-12 ENCOUNTER — Other Ambulatory Visit: Payer: Self-pay | Admitting: Neurology

## 2020-02-12 DIAGNOSIS — R1030 Lower abdominal pain, unspecified: Secondary | ICD-10-CM

## 2020-02-29 ENCOUNTER — Other Ambulatory Visit: Payer: Self-pay

## 2020-02-29 ENCOUNTER — Ambulatory Visit
Admission: RE | Admit: 2020-02-29 | Discharge: 2020-02-29 | Disposition: A | Discharge status: Home / Self Care | Payer: Medicare Other | Source: Ambulatory Visit | Attending: Surgery | Admitting: Surgery

## 2020-02-29 DIAGNOSIS — R1030 Lower abdominal pain, unspecified: Secondary | ICD-10-CM

## 2020-02-29 MED ORDER — IOPAMIDOL (ISOVUE-300) INJECTION 61%
100.0000 mL | Freq: Once | INTRAVENOUS | Status: AC | PRN
  Administered 2020-02-29: 100 mL via INTRAVENOUS

## 2020-09-22 ENCOUNTER — Other Ambulatory Visit: Payer: Self-pay | Admitting: Family Medicine

## 2020-09-22 DIAGNOSIS — E2839 Other primary ovarian failure: Secondary | ICD-10-CM

## 2020-09-22 DIAGNOSIS — Z1231 Encounter for screening mammogram for malignant neoplasm of breast: Secondary | ICD-10-CM

## 2020-12-20 ENCOUNTER — Other Ambulatory Visit: Payer: Self-pay | Admitting: Gastroenterology

## 2020-12-20 DIAGNOSIS — R1310 Dysphagia, unspecified: Secondary | ICD-10-CM

## 2020-12-23 ENCOUNTER — Ambulatory Visit
Admission: RE | Admit: 2020-12-23 | Discharge: 2020-12-23 | Disposition: A | Discharge status: Home / Self Care | Payer: Medicare HMO | Source: Ambulatory Visit | Attending: Gastroenterology | Admitting: Gastroenterology

## 2020-12-23 DIAGNOSIS — R1310 Dysphagia, unspecified: Secondary | ICD-10-CM

## 2021-01-17 ENCOUNTER — Other Ambulatory Visit: Payer: Medicare Other

## 2021-01-17 ENCOUNTER — Ambulatory Visit
Admission: RE | Admit: 2021-01-17 | Discharge: 2021-01-17 | Disposition: A | Discharge status: Home / Self Care | Payer: Medicare HMO | Source: Ambulatory Visit | Attending: Family Medicine | Admitting: Family Medicine

## 2021-01-17 ENCOUNTER — Other Ambulatory Visit: Payer: Self-pay

## 2021-01-17 DIAGNOSIS — Z1231 Encounter for screening mammogram for malignant neoplasm of breast: Secondary | ICD-10-CM
